# Patient Record
Sex: Female | Born: 1960 | Race: White | Hispanic: No | Marital: Married | Smoking: Never smoker
Health system: Southern US, Community
[De-identification: ages and names within clinical notes are randomized; demographics above are authoritative.]

## PROBLEM LIST (undated history)

## (undated) DIAGNOSIS — J45909 Unspecified asthma, uncomplicated: Secondary | ICD-10-CM

## (undated) DIAGNOSIS — T8859XA Other complications of anesthesia, initial encounter: Secondary | ICD-10-CM

## (undated) DIAGNOSIS — G4733 Obstructive sleep apnea (adult) (pediatric): Secondary | ICD-10-CM

## (undated) DIAGNOSIS — M1990 Unspecified osteoarthritis, unspecified site: Secondary | ICD-10-CM

## (undated) DIAGNOSIS — K219 Gastro-esophageal reflux disease without esophagitis: Secondary | ICD-10-CM

## (undated) DIAGNOSIS — J383 Other diseases of vocal cords: Secondary | ICD-10-CM

## (undated) DIAGNOSIS — T4145XA Adverse effect of unspecified anesthetic, initial encounter: Secondary | ICD-10-CM

## (undated) DIAGNOSIS — Z889 Allergy status to unspecified drugs, medicaments and biological substances status: Secondary | ICD-10-CM

## (undated) DIAGNOSIS — F32A Depression, unspecified: Secondary | ICD-10-CM

## (undated) HISTORY — PX: LIPOMA EXCISION: SHX5283

## (undated) HISTORY — PX: GANGLION CYST EXCISION: SHX1691

## (undated) HISTORY — PX: NASAL SINUS SURGERY: SHX719

## (undated) HISTORY — PX: CHOLECYSTECTOMY: SHX55

## (undated) HISTORY — DX: Gastro-esophageal reflux disease without esophagitis: K21.9

## (undated) HISTORY — DX: Depression, unspecified: F32.A

## (undated) HISTORY — PX: BREAST SURGERY: SHX581

## (undated) HISTORY — DX: Other diseases of vocal cords: J38.3

## (undated) HISTORY — DX: Obstructive sleep apnea (adult) (pediatric): G47.33

---
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
## 1995-02-28 HISTORY — PX: LASIK: SHX215

## 2005-04-28 ENCOUNTER — Other Ambulatory Visit: Admission: RE | Admit: 2005-04-28 | Discharge: 2005-04-28 | Payer: Self-pay | Admitting: Family Medicine

## 2006-02-15 ENCOUNTER — Ambulatory Visit: Payer: Self-pay | Admitting: Emergency Medicine

## 2006-03-23 ENCOUNTER — Ambulatory Visit: Payer: Self-pay | Admitting: Emergency Medicine

## 2006-04-23 ENCOUNTER — Ambulatory Visit: Payer: Self-pay | Admitting: Emergency Medicine

## 2006-05-25 ENCOUNTER — Ambulatory Visit (HOSPITAL_COMMUNITY): Admission: RE | Admit: 2006-05-25 | Discharge: 2006-05-25 | Payer: Self-pay | Admitting: Emergency Medicine

## 2006-06-04 ENCOUNTER — Ambulatory Visit: Payer: Self-pay | Admitting: Emergency Medicine

## 2006-12-19 DIAGNOSIS — J45909 Unspecified asthma, uncomplicated: Secondary | ICD-10-CM | POA: Insufficient documentation

## 2006-12-19 DIAGNOSIS — J309 Allergic rhinitis, unspecified: Secondary | ICD-10-CM | POA: Insufficient documentation

## 2006-12-19 DIAGNOSIS — J383 Other diseases of vocal cords: Secondary | ICD-10-CM

## 2006-12-19 DIAGNOSIS — K219 Gastro-esophageal reflux disease without esophagitis: Secondary | ICD-10-CM | POA: Insufficient documentation

## 2006-12-19 DIAGNOSIS — Z9089 Acquired absence of other organs: Secondary | ICD-10-CM | POA: Insufficient documentation

## 2006-12-19 DIAGNOSIS — G4733 Obstructive sleep apnea (adult) (pediatric): Secondary | ICD-10-CM | POA: Insufficient documentation

## 2006-12-19 HISTORY — DX: Acquired absence of other organs: Z90.89

## 2006-12-19 HISTORY — DX: Other diseases of vocal cords: J38.3

## 2007-06-21 ENCOUNTER — Encounter: Payer: Self-pay | Admitting: Emergency Medicine

## 2007-12-26 ENCOUNTER — Telehealth (INDEPENDENT_AMBULATORY_CARE_PROVIDER_SITE_OTHER): Payer: Self-pay | Admitting: *Deleted

## 2008-01-20 ENCOUNTER — Ambulatory Visit: Payer: Self-pay | Admitting: Emergency Medicine

## 2008-12-22 ENCOUNTER — Emergency Department (HOSPITAL_COMMUNITY): Admission: EM | Admit: 2008-12-22 | Discharge: 2008-12-22 | Payer: Self-pay | Admitting: Emergency Medicine

## 2009-01-13 ENCOUNTER — Encounter: Admission: RE | Admit: 2009-01-13 | Discharge: 2009-01-13 | Payer: Self-pay | Admitting: Surgery

## 2010-02-25 ENCOUNTER — Encounter
Admission: RE | Admit: 2010-02-25 | Discharge: 2010-02-25 | Payer: Self-pay | Source: Home / Self Care | Attending: *Deleted | Admitting: *Deleted

## 2010-06-02 LAB — URINALYSIS, ROUTINE W REFLEX MICROSCOPIC
Glucose, UA: NEGATIVE mg/dL
Protein, ur: NEGATIVE mg/dL
Specific Gravity, Urine: 1.006 (ref 1.005–1.030)
Urobilinogen, UA: 0.2 mg/dL (ref 0.0–1.0)

## 2010-06-02 LAB — COMPREHENSIVE METABOLIC PANEL
ALT: 14 U/L (ref 0–35)
AST: 16 U/L (ref 0–37)
Albumin: 3.5 g/dL (ref 3.5–5.2)
BUN: 7 mg/dL (ref 6–23)
Chloride: 108 mEq/L (ref 96–112)
GFR calc Af Amer: >60 mL/min (ref >60)
GFR calc non Af Amer: >60 mL/min (ref >60)
Glucose, Bld: 101 mg/dL — ABNORMAL HIGH (ref 70–99)
Potassium: 4 mEq/L (ref 3.5–5.1)
Total Bilirubin: 0.4 mg/dL (ref 0.3–1.2)

## 2010-06-02 LAB — LIPASE, BLOOD: Lipase: 21 U/L (ref 11–59)

## 2010-06-02 LAB — URINE MICROSCOPIC-ADD ON

## 2010-06-02 LAB — DIFFERENTIAL
Basophils Absolute: 0.2 10*3/uL — ABNORMAL HIGH (ref 0.0–0.1)
Basophils Relative: 3 % — ABNORMAL HIGH (ref 0–1)
Eosinophils Relative: 3 % (ref 0–5)
Lymphocytes Relative: 23 % (ref 12–46)
Monocytes Absolute: 0.9 10*3/uL (ref 0.1–1.0)

## 2010-06-02 LAB — CBC
MCHC: 34.6 g/dL (ref 30.0–36.0)
WBC: 7.8 10*3/uL (ref 4.0–10.5)

## 2010-07-15 NOTE — Assessment & Plan Note (Signed)
[facility]                             PULMONARY OFFICE NOTE   NAME:BIVINGSEmmabelle, Fear                      MRN:          [id number]  DATE:03/23/2006                            DOB:          10-16-1960    SUBJECTIVE:  Ms. Kandler is a 50 year old woman seen originally in  December of 2007 for asthma and superimposed upper airway symptoms in  the setting of upper respiratory infection and postnasal drip.  She has  completed her prednisone taper.  We also increased her Omeprazole to  twice daily and added Claritin and Nasonex for better control of her  nasal symptoms.  She tells me that she is doing much better, her  breathing has improved, and her exercise tolerance is almost at  baseline.  She was taking Symbicort twice a day but ran out of that  medication and is back to Advair 250/50 one inhalation twice a day.  Her  albuterol use has been very rare.  She still has triggers that bother  her including fumes, cold air, and smoke, these cause her to have  paroxysmal cough and shortness of breath.  She gets relief from  albuterol when she has this kind of exposure.  She has a hoarse voice  episodically, but this is much improved as well.  She originally was  going to go see Dr. Antony Contras with ENT but she tells me that she  might cancel this appointment given her improvement.   CURRENT MEDICATIONS:  1. Advair 250/50 one inhalation b.i.d.  2. Singulair 10 mg daily.  3. Omeprazole 20 mg b.i.d.  4. Nasonex nasal spray 2 sprays each nostril daily.  5. Zocor 20 mg daily.  6. Claritin 10 mg daily.   EXAMINATION:  GENERAL:  This is a pleasant overweight woman who is in no  distress on room air, her weight is 247 pounds, temperature 98.2, blood  pressure 144/82, heart rate is 71, SPO2 99% on room air.  HEENT:  She has a bit of mild posterior pharyngeal erythema, but for the  most part this is improved from our last visit.  NECK:  Supple without  stridor.  LUNGS:  Clear to auscultation bilaterally, during the normal respiratory  cycle she does have a very mild end expiratory wheeze with a forced  expiration.  There are no crackles.  HEART:  Has a regular rate and rhythm, she is borderline bradycardic,  otherwise normal.  ABDOMEN:  Benign.  EXTREMITIES:  Have no cyanosis, clubbing, or edema.  NEUROLOGIC:  Has a nonfocal exam.   IMPRESSION:  1. Asthma with triggers including fumes, cold air, and smoke.  2. Superimposed upper airway irritation with possible vocal chord      dysfunction.  This appears to be much improved with good control of      her gastroesophageal reflux disease and postnasal drip.  3. Postnasal drip and allergic rhinitis.  4. Gastroesophageal reflux disease.   PLANS:  1. Will continue her Advair 250/50 one inhalation b.i.d.  2. Continue Singulair, Claritin, and Nasonex as ordered.  I told her  that she can try decreasing the Omeprazole to once daily to see if      she can tolerate this change without any worsening in her upper      airway symptoms.  3. We will obtain full pulmonary function tests to assess her airflow      limitation now that she is back to baseline.  4. She will follow up with me in approximately one month to review the      results of her testing and to plan further therapy.     Leslye Peer, MD  Electronically Signed    RSB/MedQ  DD: 03/23/2006  DT: 03/23/2006  Job #: [id number]   cc:   Magnus Sinning. Rice, M.D.

## 2010-07-15 NOTE — Assessment & Plan Note (Signed)
[facility]                             PULMONARY OFFICE NOTE   NAME:BIVINGSNema, Oatley                      MRN:          [id number]  DATE:06/04/2006                            DOB:          22-Aug-1960    SUBJECTIVE:  Ms. Garlitz is a very pleasant 50 year old woman who  follows up today for her upper airway irritation and vocal cord symptoms  and possible superimposed trigger related asthma.  Since our last visit,  she has undergone provocational testing with a methacholine challenge as  detailed below.  She tells me that here breathing at this time is  okay.  She does occasionally get dyspneic when she exerts herself and  when she works in the yard.  She is taking Advair once daily, Singulair  and Nasacort once daily, and Zyrtec every other day.  She believes that  her allergy symptoms are fairly well controlled at this time.  She  rarely uses supplemental albuterol.   PHYSICAL EXAMINATION:  GENERAL:  This is a well-appearing overweight  woman who is in no distress on room air.  VITAL SIGNS:  Weight 252 pounds, temperature 97.6, blood pressure  122/78, heart rate 61, SPO2 100% on room air.  HEENT:  The oropharynx is clear.  She has some mild posterior pharyngeal  erythema.  There is no stridor.  Her voice is much improved compared  with our previous visits and is not hoarse at all.  LUNGS:  Clear to auscultation bilaterally without any wheezing on forced  expiration.  HEART:  Regular rate and rhythm without murmur.  ABDOMEN:  Benign.  EXTREMITIES:  No cyanosis, clubbing, or edema.   Methacholine challenge was performed on May 25, 2006, and this showed  normal pretest spirometry with markedly positive hyper-responsiveness to  methacholine and a PC (-20) less than 0.05 which reflected a positive  test on the first dose given.   IMPRESSION:  1. 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  2. Trigger related asthma with triggers that include cold air,      allergic exposures including working in the yard, and exercise.  We      discussed the different options including continuing maintenance      everyday therapy versus discontinuing this and using bronchodilator      in the setting of triggers and with symptoms.  She would like a      trial off of Advair to pursue treating her in the setting of      triggers.  I will stop her Advair, continue her albuterol which she      will use on as-needed basis, and she will also attempt to pretreat      before working in the yard, exerting herself, or being exposed to      cold air.  We will change her Zyrtec to everyday and she will      continue her Nasacort and Singulair everyday as already ordered.  3. She will follow up with me in 2-3 months or sooner  should she have      any difficulty in the interim.     Leslye Peer, MD  Electronically Signed    RSB/MedQ  DD: 06/04/2006  DT: 06/04/2006  Job #: [id number]   cc:   Magnus Sinning. Rice, M.D.

## 2010-07-15 NOTE — Assessment & Plan Note (Signed)
[facility]                             PULMONARY OFFICE NOTE   NAME:[name], Stone                      MRN:          [id number]  DATE:02/15/2006                            DOB:          05-03-1960    PULMONARY CONSULTATION   REASON FOR CONSULTATION:  We are asked by Dr. Dimple Casey in [place], [place], to evaluate Amber Stone for asthma.   BRIEF HISTORY:  Amber Stone is a 50 year old woman with a history of  sinusitis and sinus surgery in 1992.  She has also been diagnosed with  obstructive sleep apnea for which she uses CPAP.  Finally, she was  diagnosed in 2005 with asthma.  Her asthma had been fairly well  controlled and had been followed by Dr. Lowanda Foster.  She unfortunately  experienced a significant exacerbation beginning December 1 of this  year.  She feels that this may have been in the setting of second-hand  smoke exposure.  She also notes that she was emotional at the time and  angry at her husband and wonders whether there could have been an  emotional component to her symptoms.  Regardless, she has remained short  of breath and has had wheezing since that time.  She has been seen by  Dr. Dimple Casey who performed a chest x-ray that did not show any significant  abnormality.  She was treated with prednisone and azithromycin beginning  on December 5 with a slow taper.  She was seen again on December 11 with  continued wheezing, chest and neck tightness, and shortness of breath.  At that appointment, she was noted to have oral thrush and her  maintenance Advair was changed to Symbicort.  She was also restarted on  higher dose prednisone with a slower taper.  She reported that she had  been using her rescue inhaler about 3-4 times daily at that time.  She  continues to hearing wheezing which she describes as a loud noise that  can be heard by others.  She is also feeling weak and has a hoarse  voice.  She now has shortness of breath with  exertion and has to stop  when she climbs the stairs.  She also feels tightness in her neck.  Yesterday, she began to have a cough which is nonproductive.  She has  had decreased energy and states that since beginning the prednisone, she  has had insomnia and daytime sleepiness.  Her review of systems is  otherwise significant for some dull aching in her back and acid  indigestion which is usually well-controlled.  She denies any  significant postnasal nasal drip.   PAST MEDICAL HISTORY:  1. Asthma.  2. Obstructive sleep apnea on CPAP at 4 cm of water.  3. Sinusitis with a history of sinus surgery in 1992.  4. GERD.  5. Tonsillectomy in 1978.   ALLERGIES:  No known drug allergies.   CURRENT MEDICATIONS:  1. Symbicort 160/4.5 two puffs b.i.d.  2. Singulair 10 mg daily.  3. Omeprazole 20 mg daily.  4. Nasonex nasal spray 2 sprays to each nostril daily.  5. Zocor 20 mg daily.  6. Prednisone 40 mg daily.   SOCIAL HISTORY:  The patient is married.  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  She does not have any significant  tobacco exposure.  She drinks alcohol occasionally.  She was in the  [employer] and worked at [employer] in the past.  She does not  have any known TB exposures.  She has 2 cats in the home and a remote  exposure to a bird but has not been exposed to this since 1980.   FAMILY HISTORY:  Significant for emphysema in her grandfather, coronary  disease in both parents, and rheumatoid disease in her mother's family.   REVIEW OF SYSTEMS:  As per the HPI.   EXAM:  GENERAL:  This is a well-appearing woman in no distress on room  air.  Her weight is 243 pounds.  Temperature is 98.2.  Blood pressure 144/88.  Heart rate 73.  SPO2 96%  on room air.  HEENT:  She has a narrow oropharynx.  There is no evidence of posterior  pharyngeal erythema or thrush.  NECK:  Without stridor.  LUNGS:  Clear to auscultation bilaterally.  HEART:  Regular rate  and rhythm without murmur.  ABDOMEN:  Soft, nontender, nondistended with positive bowel sounds.  EXTREMITIES:  No cyanosis, clubbing, or edema.  Neurologically, she has a grossly nonfocal exam.   IMPRESSION:  1. Probable asthma.  2. 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   PLAN:  1. A quick taper of her prednisone as this may be actually      exacerbating GERD and any impact the GERD is having on her upper      airway disease.  2. Increase her omeprazole to 20 mg b.i.d.  3. Add Claritin over-the-counter 10 mg daily to her Nasonex daily.  4. Continue her Symbicort and albuterol p.r.n.  5. I will follow up with Amber Stone in 3 weeks.  6. After her current exacerbation is improved, then we will perform      full pulmonary function testing to better quantify her air flow      limitation.     Leslye Peer, MD  Electronically Signed    RSB/MedQ  DD: 02/23/2006  DT: 02/23/2006  Job #: [id number]

## 2010-07-15 NOTE — Assessment & Plan Note (Signed)
[facility]                             PULMONARY OFFICE NOTE   NAME:BIVINGSLamar, Stone                      MRN:          [id number]  DATE:04/23/2006                            DOB:          04-17-1960    [name] is a pleasant 50 year old woman who follows up today with  regard to her shortness of breath and possible asthma.  She has had  pulmonary function testing performed, and we are here to discuss the  results.  Overall, she tells me that her breathing is doing fairly well.  She does still have episodes of dyspnea that appear to be triggered by  cold and also perfumes, and strong odors including smoke.  She has had  trouble with postnasal drip and hoarse voice.  We had started her on  Claritin to help with postnasal drip, and this was effective, but it may  have increased her blood pressure, and so the medicine was stopped.   CURRENT MEDICATIONS:  1. Advair 250/50 one inhalation daily.  2. Singulair 10 mg daily.  3. Omeprazole 20 mg b.i.d.  4. Nasonex nasal spray 2 sprays each nostril daily.  5. Zocor 20 mg daily.  6. Albuterol 2 puffs q. 4 hours p.r.n. for shortness of breath.   EXAMINATION:  GENERAL:  This is a pleasant, well-appearing, obese woman  who is in no distress on room air.  Her weight is 251 pounds.  Temperature 97.4.  Blood pressure 126/80.  Heart rate 59.  SP02 99% on room air.  HEENT EXAM:  Her voice is stronger and less hoarse than previous visits.  Her oropharynx is clear.  NECK:  Large, but supple.  There is no lymphadenopathy or stridor.  LUNGS:  Clear to auscultation bilaterally.  She has no wheezing on a  forced expiration.  HEART:  Has a regular rate and rhythm without murmur.  ABDOMEN:  Obese, soft and non-tender with positive bowel sounds.  EXTREMITIES:  Have no cyanosis, clubbing or edema.  Pulmonary function testing performed on April 23, 2006 showed grossly  normal spirometry with an FVC of 3.43 or 92% of  predicted, and FEV1 of  2.81 or 98% of predicted, and a ratio of 82%.  There was no  bronchodilator responsiveness.  Her flow volume showed some evidence of  inspiratory loop chinking suggestive of possible upper airway  inflammation or vocal cord dysfunction.  The expiratory phase of her  loop was normal.  Her lung volumes were slightly decreased with a total  lung capacity of 77% predicted, which could be consistent with mild  restriction.  Her effusion capacity was slightly decreased at 74% of  predicted and corrected into the normal range when adjusted for alveolar  volume.   IMPRESSION:  1. 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  This appears to be improved now      that she is on omeprazole for her gastroesophageal reflux disease,      but postnasal drip is still a strong contributor.  2. Possible superimposed trigger related  asthma with sensitivity to      fumes, smoke and cold air.  3. Allergic rhinitis and postnasal drip that appears to be poorly      controlled.  4. Gastroesophageal reflux disease that appears to be well controlled.   PLAN:  1. I will continue her Advair 250/50 one inhalation b.i.d. for now and      albuterol p.r.n.  2. We need to more tightly control her postnasal drip.  She will try      Zyrtec and she if she tolerates this better from a blood pressure      standpoint.  We will continue her nasal steroid.  She will also      consider starting nasal saline washes.  3. I will order a methacholine challenge to evaluate for lower airways      bronchial hyperresponsiveness.  If this is present, then I believe      she will need to stay on maintenance therapy with Advair.  If not,      we may be able to consider peeling this medication off.  4. I will follow up with Amber Stone in 6 weeks, or sooner should she      have any difficulties.     Amber Peer, MD  Electronically Signed     RSB/MedQ  DD: 04/24/2006  DT: 04/24/2006  Job #: [id number]   cc:   Amber Stone, M.D.

## 2010-08-17 ENCOUNTER — Encounter: Payer: Self-pay | Admitting: Emergency Medicine

## 2010-08-18 ENCOUNTER — Ambulatory Visit (INDEPENDENT_AMBULATORY_CARE_PROVIDER_SITE_OTHER): Payer: 59 | Admitting: Emergency Medicine

## 2010-08-18 ENCOUNTER — Encounter: Payer: Self-pay | Admitting: Emergency Medicine

## 2010-08-18 VITALS — BP 120/84 | HR 74 | Temp 97.7°F | Ht 67.5 in | Wt 226.0 lb

## 2010-08-18 DIAGNOSIS — J45909 Unspecified asthma, uncomplicated: Secondary | ICD-10-CM

## 2010-08-18 MED ORDER — PREDNISONE 50 MG PO TABS: 50.0000 mg | ORAL_TABLET | Freq: Every day | ORAL | Status: AC

## 2010-08-18 NOTE — Patient Instructions (Addendum)
Stop Symbicort Finish your Levaquin prescription Take prednisone 5 days as directed Continue your current allergy medications Continue your omeprazole.  Follow up with Dr Delton Coombes in 1 month

## 2010-08-18 NOTE — Assessment & Plan Note (Signed)
No wheeze today, suspect asthma component has been treated and she has residual UA irritation Pred burst Hold symboicort rov in 1 mo

## 2010-08-18 NOTE — Progress Notes (Signed)
  Subjective:    Patient ID: Amber Stone, female    DOB: 1960-11-26, 50 y.o.   MRN: [id number]  HPI [name] is a very pleasant 50 year old woman, hx upper airway irritation and vocal cord symptoms and superimposed trigger related asthma (with positive methacholine!). Also with OSA. Last seen in 12/2007.   Has not had frequent flares in the interim. Began to have UA noise, wheeze, severe cough end of May. Has been taking her zyrtec, singulair, nasonex. Rarely needs albuterol if not exacerbated. Not on Advair - she started Symbicort early June. Was treated with pred taper, levaquin. Another round levaquin just started.    CXR 08/15/10 -- no infiltrates, mild hyperinflation.     Review of Systems As above    Objective:   Physical Exam Gen: Pleasant, overweight, in no distress,  normal affect  ENT: No lesions,  mouth clear,  oropharynx clear, no postnasal drip, hoarse voice  Neck: No JVD, no TMG, no carotid bruits  Lungs: No wheezing, no crackles  Cardiovascular: RRR, heart sounds normal, no murmur or gallops, no peripheral edema  Musculoskeletal: No deformities, no cyanosis or clubbing  Neuro: alert, non focal  Skin: Warm, no lesions or rashes      Assessment & Plan:

## 2010-08-23 ENCOUNTER — Telehealth: Payer: Self-pay | Admitting: Emergency Medicine

## 2010-08-23 NOTE — Telephone Encounter (Signed)
Spoke with pt and notified of recs per RB. She verbalized understanding. States that she is going to give a few days, and then call us back to discuss sinus eval.

## 2010-08-23 NOTE — Telephone Encounter (Signed)
I think this is upper airway irritation - doubt any more pred or abx will impact this. Needs to stay on her allergy regimen and her omeprazole. Consider sinus eval? Can see her back at [facility] next available to discuss and possibly perform sinus CT scan depending on her sx.

## 2010-08-23 NOTE — Telephone Encounter (Signed)
Spoke with pt. She states that she was here last on 08/18/10- she has finished her pred taper and levaquin and cough is not much improved, still producing dark green sputum and has "tight feeling in throat", hoarsness. Would like to know if needs additional rxs. Please advise, thanks!

## 2010-09-26 ENCOUNTER — Ambulatory Visit: Payer: 59 | Admitting: Emergency Medicine

## 2011-05-14 IMAGING — US US ABDOMEN COMPLETE
1 series · 14 of 25 positions shown · non-contrast
Comparison: None.

CLINICAL DATA: Abdominal pain.

ABDOMINAL ULTRASOUND COMPLETE

[Series 1: us abdomen complete · 0.30mm/px · 14 of 59 slices shown]
[im 1/59]
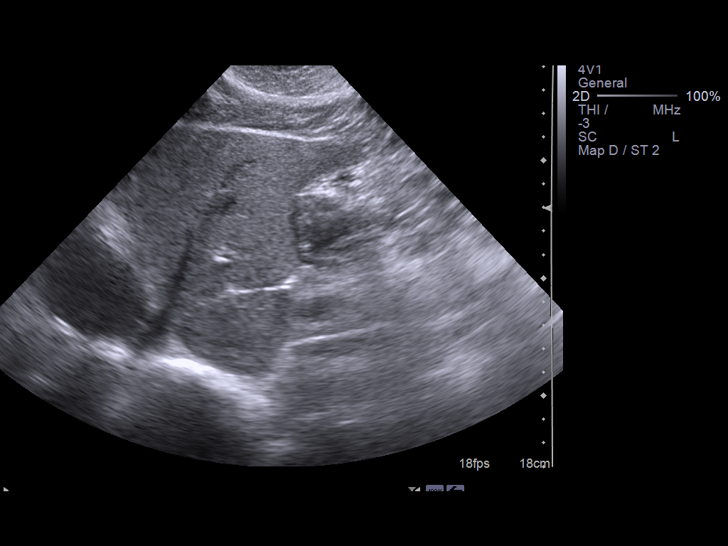
[im 5/59]
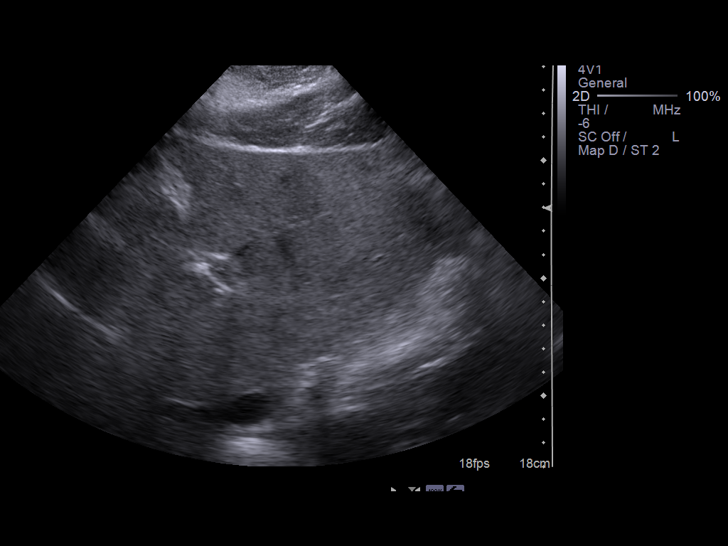
[im 10/59]
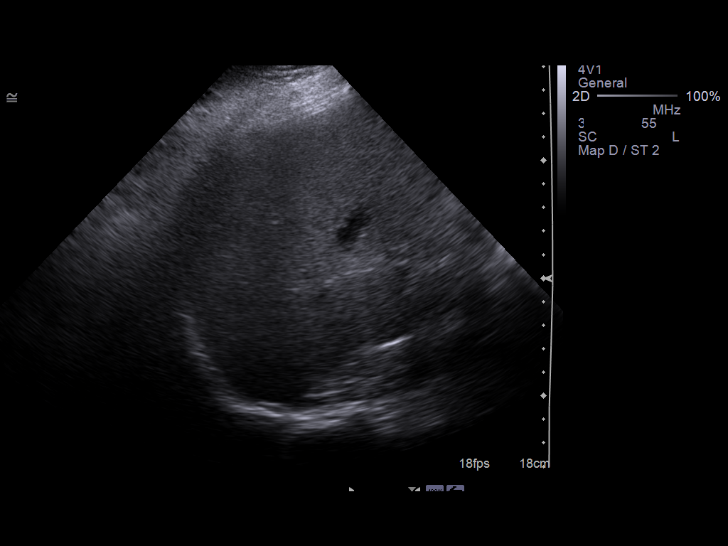
[im 15/59]
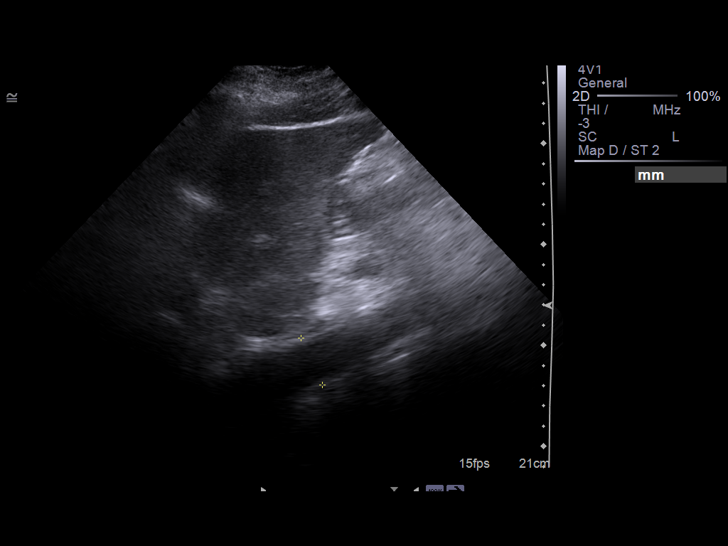
[im 20/59]
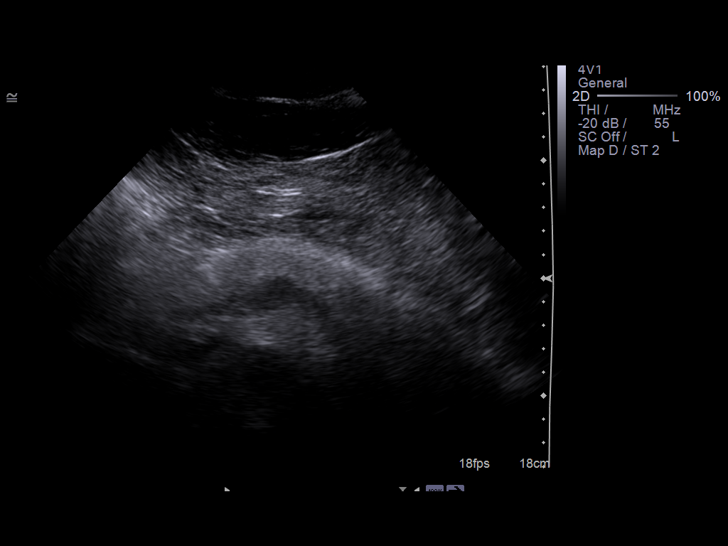
[im 22/59]
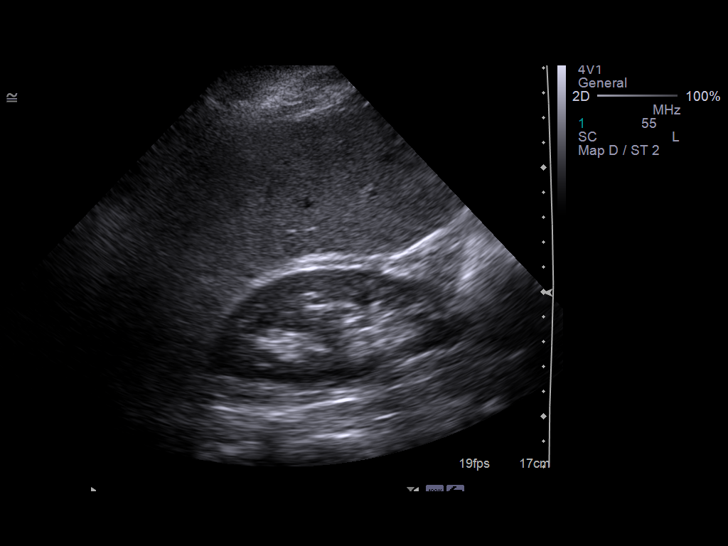
[im 27/59]
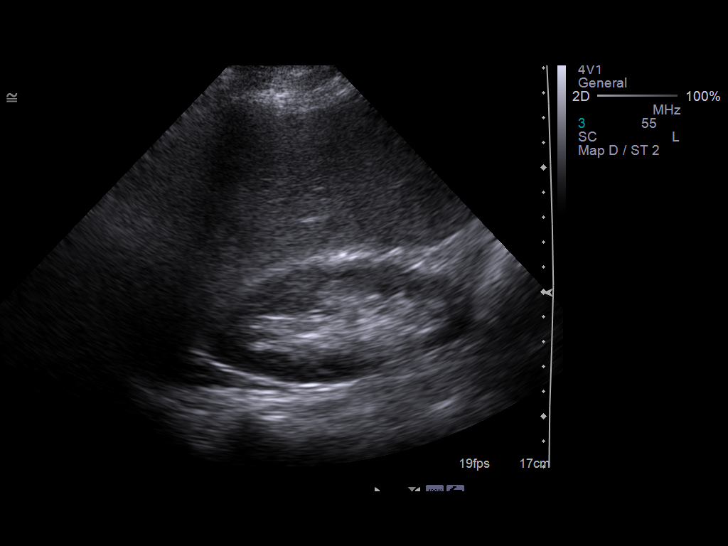
[im 32/59]
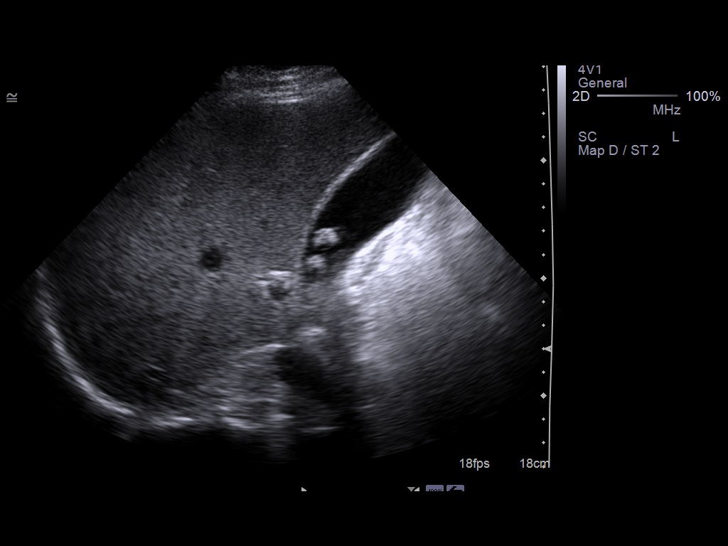
[im 37/59]
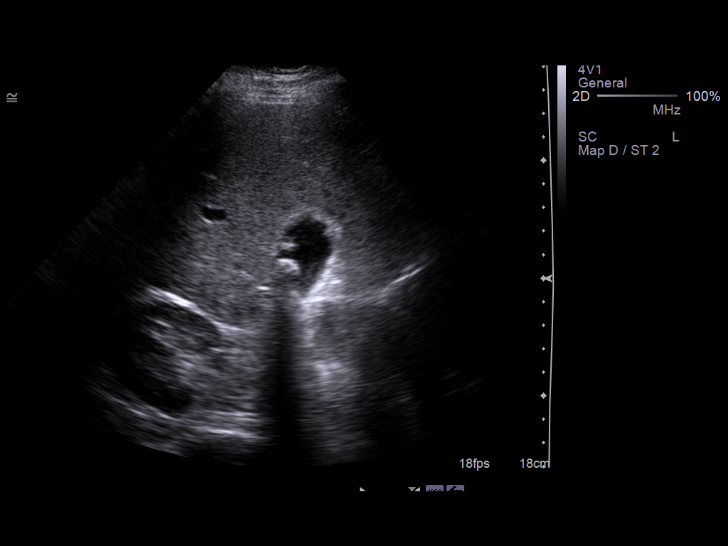
[im 39/59]
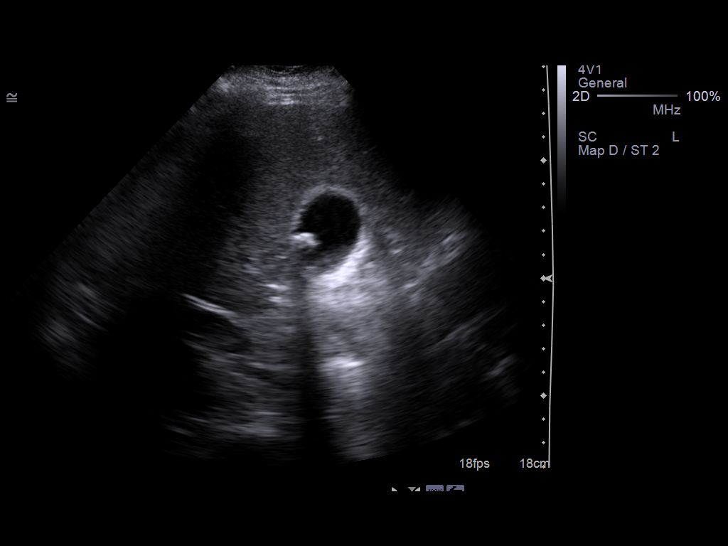
[im 44/59]
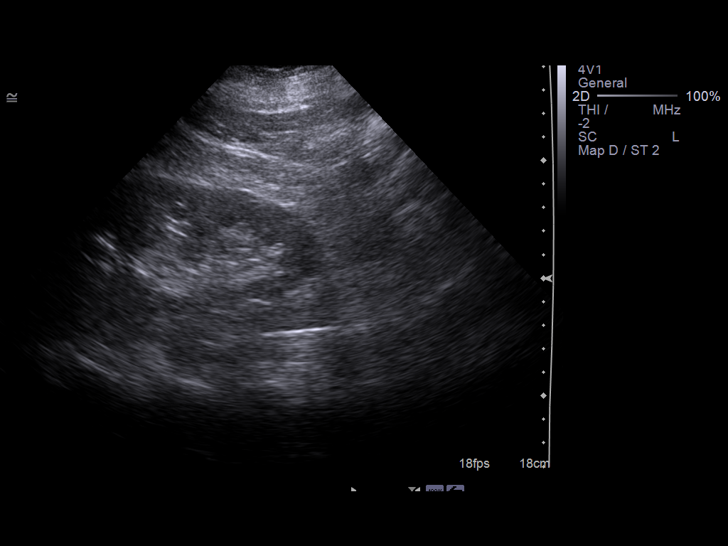
[im 49/59]
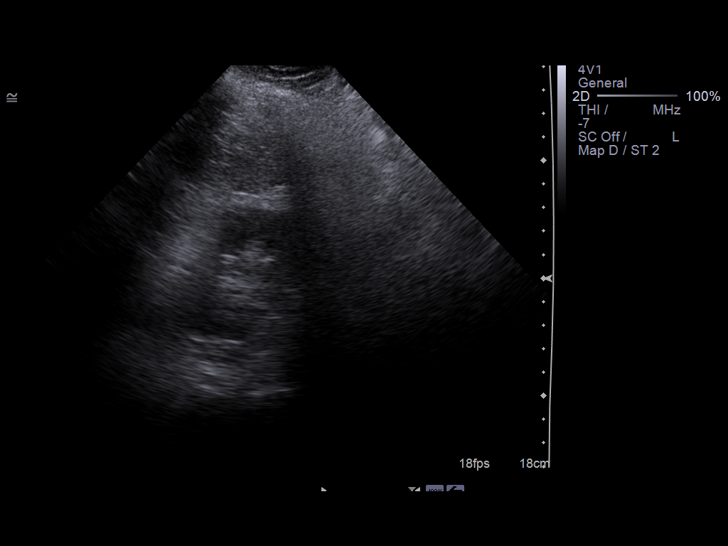
[im 54/59]
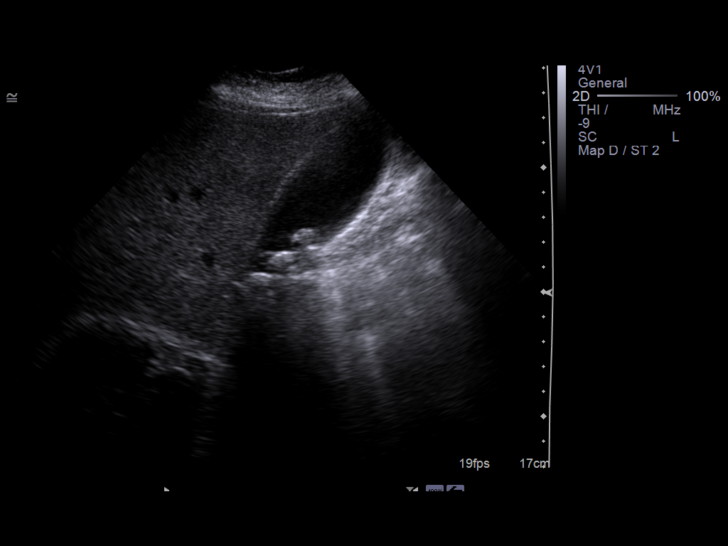
[im 59/59]
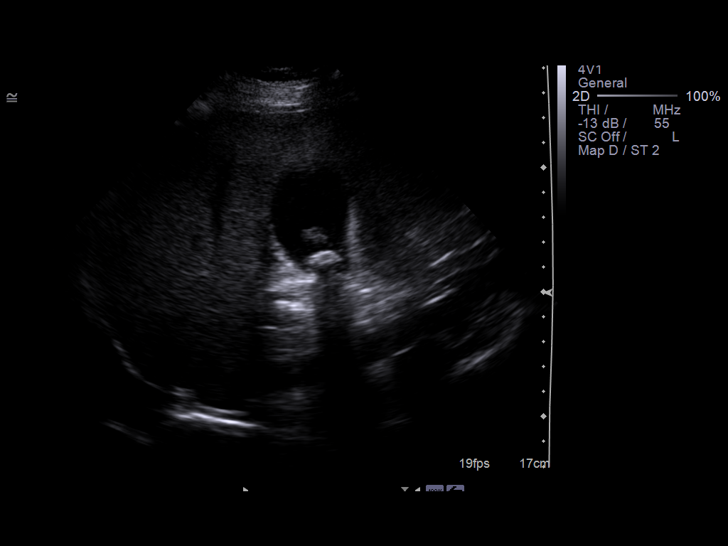

[14 of 25 positions shown; findings below may reference images not displayed]

FINDINGS: Gallbladder:  Multiple gallstones in the gallbladder, the largest
measuring 1.4 cm in maximum diameter.  Mild diffuse gallbladder
wall thickening with a maximum thickness of 3.9 mm.  No
pericholecystic fluid.  The patient was not tender over the
gallbladder.

Common bile duct:  Normal diameter, measuring 3.3 mm proximally.

Liver:  Normal.

Inferior vena cava:  The visualized portion appears normal.

Pancreas:  Normal.

Spleen:  Normal, measuring 5.6 cm in length.

Right kidney:  Normal, measuring 11.1 cm in length.

Left kidney:  Normal, measuring 12.3 cm in length.

Abdominal aorta:  Normal caliber without aneurysm.

Other:  No free peritoneal fluid.
IMPRESSION: 1.  Cholelithiasis.
2.  Mild diffuse gallbladder wall thickening, most likely due to
chronic cholecystitis.
3.  Otherwise, unremarkable examination.

## 2011-11-03 ENCOUNTER — Encounter (INDEPENDENT_AMBULATORY_CARE_PROVIDER_SITE_OTHER): Payer: Self-pay | Admitting: General Surgery

## 2011-11-10 ENCOUNTER — Ambulatory Visit (INDEPENDENT_AMBULATORY_CARE_PROVIDER_SITE_OTHER): Payer: Self-pay | Admitting: General Surgery

## 2012-03-19 DIAGNOSIS — N63 Unspecified lump in unspecified breast: Secondary | ICD-10-CM | POA: Insufficient documentation

## 2012-03-26 DIAGNOSIS — I82A19 Acute embolism and thrombosis of unspecified axillary vein: Secondary | ICD-10-CM | POA: Insufficient documentation

## 2012-03-26 DIAGNOSIS — I82B12 Acute embolism and thrombosis of left subclavian vein: Secondary | ICD-10-CM | POA: Insufficient documentation

## 2013-03-26 ENCOUNTER — Ambulatory Visit (INDEPENDENT_AMBULATORY_CARE_PROVIDER_SITE_OTHER): Payer: BC Managed Care – PPO | Admitting: Surgery

## 2013-03-26 ENCOUNTER — Encounter (INDEPENDENT_AMBULATORY_CARE_PROVIDER_SITE_OTHER): Payer: Self-pay | Admitting: Surgery

## 2013-03-26 VITALS — BP 148/100 | HR 77 | Temp 98.4°F | Resp 14 | Ht 68.0 in | Wt 234.4 lb

## 2013-03-26 DIAGNOSIS — M7989 Other specified soft tissue disorders: Secondary | ICD-10-CM

## 2013-03-26 DIAGNOSIS — M799 Soft tissue disorder, unspecified: Secondary | ICD-10-CM

## 2013-03-26 HISTORY — DX: Other specified soft tissue disorders: M79.89

## 2013-03-26 NOTE — Progress Notes (Signed)
[facility]  Chief Complaint  Patient presents with  . New Evaluation    lipoma - referral from Dr. Octavio Graves    HISTORY: Patient is a 53 year old female referred by her primary care physician for evaluation of soft tissue mass arising at the left costal margin. Patient first noted this before Christmas and 2014. She feels as though it has gradually increased in size. He does not cause any discomfort. She denies any history of trauma. She denies any signs of infection or other cutaneous changes. 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  Past Medical History  Diagnosis Date  . Asthma   . GERD (gastroesophageal reflux disease)   . Allergic rhinitis   . OSA (obstructive sleep apnea)   . Disorder of vocal cords     Current Outpatient Prescriptions  Medication Sig Dispense Refill  . albuterol (PROAIR HFA) 108 (90 BASE) MCG/ACT inhaler Inhale 2 puffs into the lungs every 4 (four) hours as needed.        . cetirizine (ZYRTEC) 10 MG tablet Take 10 mg by mouth daily.      . Cholecalciferol (VITAMIN D-3) 5000 UNITS TABS Take by mouth.      . Fish Oil OIL by Does not apply route.      Marland Kitchen ipratropium-albuterol (DUONEB) 0.5-2.5 (3) MG/3ML SOLN       . mometasone (NASONEX) 50 MCG/ACT nasal spray Place 2 sprays into the nose daily.        . montelukast (SINGULAIR) 10 MG tablet Take 10 mg by mouth at bedtime.        . Multiple Vitamin (MULTIVITAMIN) capsule Take 1 capsule by mouth daily.      Marland Kitchen omeprazole (PRILOSEC) 20 MG capsule Take 20 mg by mouth daily.        . pravastatin (PRAVACHOL) 40 MG tablet        No current facility-administered medications for this visit.    No Known Allergies  Family History  Problem Relation Age of Onset  . Emphysema    . Coronary artery disease Mother   . Rheum arthritis Mother   . Coronary artery disease Father   . Heart disease Father   . Cancer  Paternal Aunt     breast  . Cancer Maternal Grandfather     leukemia  . Cancer Paternal Grandmother     breast    History   Social History  . Marital Status: Married    Spouse Name: N/A    Number of Children: N/A  . Years of Education: N/A   Social History Main Topics  . Smoking status: Never Smoker   . Smokeless tobacco: Never Used  . Alcohol Use: No  . Drug Use: No  . Sexual Activity: None   Other Topics Concern  . None   Social History Narrative  . None    REVIEW OF SYSTEMS - PERTINENT POSITIVES ONLY: Palpable abnormalities left costal margin. No sign of infection. No history of trauma. No pain.  EXAM: Filed Vitals:   03/26/13 0935  BP: 148/100  Pulse: 77  Temp: 98.4 F (36.9 C)  Resp: 14    GENERAL: well-developed, well-nourished, no acute distress HEENT: normocephalic; pupils equal and reactive; sclerae clear; dentition good; mucous membranes moist CHEST: clear to auscultation bilaterally without rales, rhonchi, or wheezes CARDIAC: regular rate and rhythm without significant murmur; peripheral pulses are full  CHEST: Diffuse thickening of soft tissue in the subcutaneous space left anterior costal margin; slight asymmetry of the costal margins with the left protruding more anteriorly; no discrete mass; no tenderness BACK:  Palpable soft tissue mass low lumbar region just to the left of midline, approximately 3 cm in size, consistent with lipoma EXT:  non-tender without edema; no deformity NEURO: no gross focal deficits; no sign of tremor   LABORATORY RESULTS: See [facility] (CHL-Epic) for most recent results  RADIOLOGY RESULTS: See [facility] (CHL-Epic) for most recent results  IMPRESSION: #1 soft tissue thickening anterior left costal margin of undetermined significance #2 soft tissue mass low in the left lumbar region consistent with lipoma  PLAN: I discussed both of the above findings with the patient and her husband. I explained that  these both appear to be benign processes that are not symptomatic. The thickening of tissue at the left costal margin does not appear to be a lipoma. I find no worrisome features on exam. I've asked the patient to apply topical creams and massage this once daily. I would like to reexamine her in 6 weeks. At that point we will decide about further diagnostic studies or surgical intervention or expected management.  Earnstine Regal, MD, [facility]  Primary Care Physician: Octavio Graves, DO

## 2013-03-26 NOTE — Patient Instructions (Signed)
  COCOA BUTTER & VITAMIN E CREAM  ([employer] or other brand)  Apply cocoa butter/vitamin E cream to your incision 2 - 3 times daily.  Massage cream into incision for one minute with each application.  Use sunscreen (50 SPF or higher) for first 6 months after surgery if area is exposed to sun.  You may substitute Mederma or other scar reducing creams as desired.   

## 2013-05-06 ENCOUNTER — Ambulatory Visit (INDEPENDENT_AMBULATORY_CARE_PROVIDER_SITE_OTHER): Payer: BC Managed Care – PPO | Admitting: Surgery

## 2013-05-06 ENCOUNTER — Encounter (INDEPENDENT_AMBULATORY_CARE_PROVIDER_SITE_OTHER): Payer: Self-pay | Admitting: Surgery

## 2013-05-06 VITALS — BP 126/82 | Temp 98.6°F | Resp 16 | Ht 67.5 in | Wt 235.6 lb

## 2013-05-06 DIAGNOSIS — M799 Soft tissue disorder, unspecified: Secondary | ICD-10-CM

## 2013-05-06 DIAGNOSIS — M7989 Other specified soft tissue disorders: Secondary | ICD-10-CM

## 2013-05-06 NOTE — Addendum Note (Signed)
Addended by: Illene Regulus on: 05/06/2013 05:18 PM   Modules accepted: Orders

## 2013-05-06 NOTE — Progress Notes (Signed)
General Surgery [facility]  Chief Complaint  Patient presents with  . Follow-up    mass left costal margin    HISTORY: Patient is a 53 year old female previously evaluated for possible lipoma at the left costal margin and another lipoma in the lumbar region of the lower back. Patient was initially seen and evaluated. She was performing massage treatment to the area. I have asked her to return for repeat evaluation.  PERTINENT REVIEW OF SYSTEMS: Patient notes that the mass at the left costal margin has become larger and more firm. The lumbar mass does not cause her any discomfort.  EXAM: HEENT: normocephalic; pupils equal and reactive; sclerae clear; dentition good; mucous membranes moist NECK:  symmetric on extension; no palpable anterior or posterior cervical lymphadenopathy; no supraclavicular masses; no tenderness CHEST: Visual examination of the left chest wall shows a mass at the mid left costal margin approximately in the anterior axillary line; palpation shows this to be quite firm and more prominent than on her last examination. It extends over an area of approximately 6 cm. It is asymmetric when compared to the right costal margin. EXT:  non-tender without edema; no deformity NEURO: no gross focal deficits; no sign of tremor   IMPRESSION: Firm mass involving approximately the 10th or ninth rib on the left side at the anterior axillary line of undetermined significance  PLAN: I discussed this with the patient and her spouse. I discussed this with the radiologist by telephone. They have recommended MRI scan with and without contrast. We will arrange for this study in the near future. I will contact the patient with the results.  Earnstine Regal, MD, [facility] Office: [phone]  Visit Diagnoses: 1. Soft tissue mass, left anterior costal margin

## 2013-05-07 ENCOUNTER — Telehealth (INDEPENDENT_AMBULATORY_CARE_PROVIDER_SITE_OTHER): Payer: Self-pay | Admitting: *Deleted

## 2013-05-07 NOTE — Telephone Encounter (Signed)
I spoke with Amber Stone and informed her of the appt for her MRI at [facility] on 05/13/13 with an arrival time of 4:30pm.  I instructed her not to have any solid foods 4 hours prior to scan.  She is also requesting something to relax her during the MRI.  I informed her that I would send a message to Dr. Harlow Asa.  She is agreeable with this plan at this time.

## 2013-05-08 NOTE — Telephone Encounter (Signed)
Pt advised of attached msg from Dr Harlow Asa. Pt states she understands and should be fine.

## 2013-05-08 NOTE — Telephone Encounter (Signed)
Sedation is not necessary for this type of MRI scan.  Earnstine Regal, MD, [facility] Office: [phone]

## 2013-05-12 ENCOUNTER — Telehealth (INDEPENDENT_AMBULATORY_CARE_PROVIDER_SITE_OTHER): Payer: Self-pay | Admitting: *Deleted

## 2013-05-12 NOTE — Telephone Encounter (Signed)
MRI abdomen W Wo contrast scheduled for 05/13/13 .Marland Kitchen Auth #:[id number] per online.

## 2013-05-13 ENCOUNTER — Ambulatory Visit
Admission: RE | Admit: 2013-05-13 | Discharge: 2013-05-13 | Disposition: A | Discharge status: Home / Self Care | Payer: BC Managed Care – PPO | Source: Ambulatory Visit | Attending: Surgery | Admitting: Surgery

## 2013-05-13 DIAGNOSIS — M7989 Other specified soft tissue disorders: Secondary | ICD-10-CM

## 2013-05-13 MED ORDER — GADOBENATE DIMEGLUMINE 529 MG/ML IV SOLN
20.0000 mL | Freq: Once | INTRAVENOUS | Status: AC | PRN
  Administered 2013-05-13: 20 mL via INTRAVENOUS

## 2013-05-15 ENCOUNTER — Telehealth (INDEPENDENT_AMBULATORY_CARE_PROVIDER_SITE_OTHER): Payer: Self-pay | Admitting: Surgery

## 2013-05-15 DIAGNOSIS — M7989 Other specified soft tissue disorders: Secondary | ICD-10-CM

## 2013-05-15 NOTE — Telephone Encounter (Signed)
Telephone call to patient with MRI results.  No evidence of neoplasm.  Acute angulation of left 8th rib.  Suggest thoracic surgery evaluation if indicated.  Earnstine Regal, MD, [facility] Office: [phone]

## 2013-09-05 ENCOUNTER — Ambulatory Visit (INDEPENDENT_AMBULATORY_CARE_PROVIDER_SITE_OTHER): Payer: BC Managed Care – PPO | Admitting: Emergency Medicine

## 2013-09-05 ENCOUNTER — Encounter: Payer: Self-pay | Admitting: Emergency Medicine

## 2013-09-05 VITALS — BP 144/98 | HR 81 | Ht 67.5 in | Wt 239.0 lb

## 2013-09-05 DIAGNOSIS — J45909 Unspecified asthma, uncomplicated: Secondary | ICD-10-CM

## 2013-09-05 MED ORDER — TRAMADOL HCL 50 MG PO TABS: 50.0000 mg | ORAL_TABLET | Freq: Four times a day (QID) | ORAL | Status: DC | PRN

## 2013-09-05 MED ORDER — PREDNISONE 20 MG PO TABS: 40.0000 mg | ORAL_TABLET | Freq: Every day | ORAL | Status: DC

## 2013-09-05 NOTE — Assessment & Plan Note (Signed)
-   pred 40 x 5 days - ultram for cough / pain - restart symbicort for now; consider stopping after this flare clears vs continuing indefinitely.  - albuterol prn - repeat spirometry when she has recovered.  - rov 1

## 2013-09-05 NOTE — Progress Notes (Signed)
Subjective:    Patient ID: Amber Stone, female    DOB: [birth year], 53 y.o.   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 She has GERD and allergies. We havn't seen each other for almost 3 years. She has been doing fairly well - has had a few discrete flares that were related to triggers. She was treated for a flare over the last month with rounds of pred and abx. She is left with coughing, no-prod. She is more dyspneic than her baseline - helped by albuterol. She hears wheeze w cough. She required duonebs early in the course. Averaging albuterol 2x a day now. Her GERD is well- controlled.    Review of Systems  Constitutional: Negative for fever and unexpected weight change.  HENT: Positive for postnasal drip. Negative for congestion, dental problem, ear pain, nosebleeds, rhinorrhea, sinus pressure, sneezing, sore throat and trouble swallowing.   Eyes: Negative for redness and itching.  Respiratory: Positive for cough and shortness of breath. Negative for chest tightness and wheezing.   Cardiovascular: Positive for chest pain. Negative for palpitations and leg swelling.  Gastrointestinal: Negative for nausea and vomiting.  Genitourinary: Negative for dysuria.  Musculoskeletal: Negative for joint swelling.  Skin: Negative for rash.  Neurological: Negative for headaches.  Hematological: Does not bruise/bleed easily.  Psychiatric/Behavioral: Negative for dysphoric mood. The patient is not nervous/anxious.    Past Medical History  Diagnosis Date  . Asthma   . GERD (gastroesophageal reflux disease)   . Allergic rhinitis   . OSA (obstructive sleep apnea)   . Disorder of vocal cords      Family History  Problem Relation Age of Onset  . Emphysema    . Coronary artery disease Mother   . Rheum arthritis Mother   . Coronary artery disease Father   . Heart disease Father   . Cancer Paternal Aunt    breast  . Cancer Maternal Grandfather     leukemia  . Cancer Paternal Grandmother     breast     History   Social History  . Marital Status: Married    Spouse Name: N/A    Number of Children: N/A  . Years of Education: N/A   Occupational History  . medical biller    Social History Main Topics  . Smoking status: Never Smoker   . Smokeless tobacco: Never Used  . Alcohol Use: No  . Drug Use: No  . Sexual Activity: Not on file   Other Topics Concern  . Not on file   Social History Narrative  . No narrative on file     No Known Allergies   Outpatient Prescriptions Prior to Visit  Medication Sig Dispense Refill  . albuterol (PROAIR HFA) 108 (90 BASE) MCG/ACT inhaler Inhale 2 puffs into the lungs every 4 (four) hours as needed.        . cetirizine (ZYRTEC) 10 MG tablet Take 10 mg by mouth daily.      . Cholecalciferol (VITAMIN D-3) 5000 UNITS TABS Take by mouth.      . Fish Oil OIL by Does not apply route.      Marland Kitchen ipratropium-albuterol (DUONEB) 0.5-2.5 (3) MG/3ML SOLN       . mometasone (NASONEX) 50 MCG/ACT nasal spray Place 2 sprays into the nose daily.        . montelukast (SINGULAIR) 10 MG tablet Take 10 mg by mouth at bedtime.        Marland Kitchen  Multiple Vitamin (MULTIVITAMIN) capsule Take 1 capsule by mouth daily.      Marland Kitchen omeprazole (PRILOSEC) 20 MG capsule Take 20 mg by mouth daily.        . pravastatin (PRAVACHOL) 40 MG tablet        No facility-administered medications prior to visit.         Objective:   Physical Exam Filed Vitals:   09/05/13 0858  BP: 144/98  Pulse: 81  Height: 5' 7.5" (1.715 m)  Weight: 239 lb (108.41 kg)  SpO2: 98%   Gen: Pleasant, well-nourished, in no distress,  normal affect  ENT: No lesions,  mouth clear,  oropharynx clear, no postnasal drip  Neck: No JVD, no TMG, no carotid bruits  Lungs: No use of accessory muscles, clear without rales or rhonchi  Cardiovascular: RRR, heart sounds normal, no murmur or gallops, no peripheral  edema  Musculoskeletal: No deformities, no cyanosis or clubbing; mild R pectoral tenderness to palp.  Neuro: alert, non focal  Skin: Warm, no lesions or rashes      Assessment & Plan:  ASTHMA - pred 40 x 5 days - ultram for cough / pain - restart symbicort for now; consider stopping after this flare clears vs continuing indefinitely.  - albuterol prn - repeat spirometry when she has recovered.  - rov 1

## 2013-09-05 NOTE — Addendum Note (Signed)
Addended by: Elie Confer on: 09/05/2013 11:06 AM   Modules accepted: Orders

## 2013-09-05 NOTE — Patient Instructions (Signed)
Take prednisone 40 mg daily for 5 days and then stop Restart Symbicort 2 puffs twice a day. Please remember to rinse and gargle after using Continue albuterol 2 puffs as needed Continue your loratadine and Nasacort nose spray Continue your omeprazole Follow with Dr. Lamonte Sakai in 1 month to decide whether we will stay on Symbicort. We will discuss repeating her spirometry once this episode is cleared.

## 2013-10-17 ENCOUNTER — Ambulatory Visit (INDEPENDENT_AMBULATORY_CARE_PROVIDER_SITE_OTHER): Payer: BC Managed Care – PPO | Admitting: Emergency Medicine

## 2013-10-17 ENCOUNTER — Encounter: Payer: Self-pay | Admitting: Emergency Medicine

## 2013-10-17 VITALS — BP 130/74 | HR 83 | Ht 67.5 in | Wt 241.0 lb

## 2013-10-17 DIAGNOSIS — J45909 Unspecified asthma, uncomplicated: Secondary | ICD-10-CM

## 2013-10-17 DIAGNOSIS — J383 Other diseases of vocal cords: Secondary | ICD-10-CM

## 2013-10-17 MED ORDER — BUDESONIDE-FORMOTEROL FUMARATE 160-4.5 MCG/ACT IN AERO: 2.0000 | INHALATION_SPRAY | Freq: Two times a day (BID) | RESPIRATORY_TRACT | Status: AC

## 2013-10-17 MED ORDER — BUDESONIDE-FORMOTEROL FUMARATE 160-4.5 MCG/ACT IN AERO: 2.0000 | INHALATION_SPRAY | Freq: Two times a day (BID) | RESPIRATORY_TRACT | Status: DC

## 2013-10-17 NOTE — Progress Notes (Signed)
   Subjective:    Patient ID: Amber Stone, female    DOB: 1960/03/21, 53 y.o.   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 She has GERD and allergies. We havn't seen each other for almost 3 years. She has been doing fairly well - has had a few discrete flares that were related to triggers. She was treated for a flare over the last month with rounds of pred and abx. She is left with coughing, no-prod. She is more dyspneic than her baseline - helped by albuterol. She hears wheeze w cough. She required duonebs early in the course. Averaging albuterol 2x a day now. Her GERD is well- controlled.   ROV 10/17/13 -- follows for VCD and asthma, OSA, GERD, allergies. Last time we restarted symbicort, treated with a pred burst. She follows today to assess the symbicort.  Unfortunately she was just exposed to perfume in the lobby and is coughing, having stridor.    Review of Systems  Constitutional: Negative for fever and unexpected weight change.  HENT: Positive for postnasal drip. Negative for congestion, dental problem, ear pain, nosebleeds, rhinorrhea, sinus pressure, sneezing, sore throat and trouble swallowing.   Eyes: Negative for redness and itching.  Respiratory: Positive for cough and shortness of breath. Negative for chest tightness and wheezing.   Cardiovascular: Positive for chest pain. Negative for palpitations and leg swelling.  Gastrointestinal: Negative for nausea and vomiting.  Genitourinary: Negative for dysuria.  Musculoskeletal: Negative for joint swelling.  Skin: Negative for rash.  Neurological: Negative for headaches.  Hematological: Does not bruise/bleed easily.  Psychiatric/Behavioral: Negative for dysphoric mood. The patient is not nervous/anxious.       Objective:   Physical Exam Filed Vitals:   10/17/13 1540  BP: 130/74  Pulse: 83  Height: 5' 7.5" (1.715 m)  Weight: 241 lb (109.317  kg)  SpO2: 99%   Gen: Pleasant, well-nourished, in no distress,  normal affect  ENT: No lesions,  mouth clear,  oropharynx clear, no postnasal drip  Neck: No JVD, no TMG, no carotid bruits  Lungs: No use of accessory muscles, clear without rales or rhonchi  Cardiovascular: RRR, heart sounds normal, no murmur or gallops, no peripheral edema  Musculoskeletal: No deformities, no cyanosis or clubbing; mild R pectoral tenderness to palp.  Neuro: alert, non focal  Skin: Warm, no lesions or rashes      Assessment & Plan:  ASTHMA Seems to have clinically benefited from symbicort, would like to continue Needs full PFT   VOCAL CORD DISORDER Exacerbated today due to perfume exposure, but she doesn't seem to have stridor, don't believe she needs prednisone.

## 2013-10-17 NOTE — Patient Instructions (Signed)
We will continue Symbicort twice a day Use albuterol as needed for shortness of breath or wheezing.  We will fill out your handicap placard paperwork today.  Call our office if your coughing does not improve.  We will set up PFT's before your next visit Follow with Dr Lamonte Sakai in 3 months or sooner if you have any problems.

## 2013-10-17 NOTE — Assessment & Plan Note (Addendum)
Seems to have clinically benefited from symbicort, would like to continue Needs full PFT

## 2013-10-17 NOTE — Assessment & Plan Note (Signed)
Exacerbated today due to perfume exposure, but she doesn't seem to have stridor, don't believe she needs prednisone.

## 2013-11-07 ENCOUNTER — Encounter (HOSPITAL_COMMUNITY): Payer: BC Managed Care – PPO

## 2013-12-19 ENCOUNTER — Ambulatory Visit (HOSPITAL_COMMUNITY)
Admission: RE | Admit: 2013-12-19 | Discharge: 2013-12-19 | Disposition: A | Discharge status: Home / Self Care | Payer: BC Managed Care – PPO | Source: Ambulatory Visit | Attending: Emergency Medicine | Admitting: Emergency Medicine

## 2013-12-19 DIAGNOSIS — R918 Other nonspecific abnormal finding of lung field: Secondary | ICD-10-CM | POA: Insufficient documentation

## 2013-12-19 DIAGNOSIS — J45909 Unspecified asthma, uncomplicated: Secondary | ICD-10-CM

## 2013-12-19 DIAGNOSIS — R0609 Other forms of dyspnea: Secondary | ICD-10-CM | POA: Diagnosis not present

## 2013-12-19 LAB — PULMONARY FUNCTION TEST
DL/VA % pred: 104 %
DL/VA: 5.4 ml/min/mmHg/L
DLCO UNC % PRED: 95 %
DLCO UNC: 27.68 ml/min/mmHg
DLCO cor % pred: 95 %
DLCO cor: 27.68 ml/min/mmHg
FEF 25-75 Post: 2.95 L/sec
FEF 25-75 Pre: 3.36 L/sec
FEF2575-%Change-Post: -12 %
FEF2575-%PRED-PRE: 120 %
FEF2575-%Pred-Post: 105 %
FEV1-%CHANGE-POST: 0 %
FEV1-%PRED-PRE: 87 %
FEV1-%Pred-Post: 87 %
FEV1-Post: 2.64 L
FEV1-Pre: 2.66 L
FEV1FVC-%Change-Post: -3 %
FEV1FVC-%Pred-Pre: 107 %
FEV6-%CHANGE-POST: 2 %
FEV6-%Pred-Post: 85 %
FEV6-%Pred-Pre: 83 %
FEV6-PRE: 3.13 L
FEV6-Post: 3.22 L
FEV6FVC-%PRED-POST: 103 %
FEV6FVC-%PRED-PRE: 103 %
FVC-%Change-Post: 2 %
FVC-%PRED-POST: 82 %
FVC-%Pred-Pre: 80 %
FVC-POST: 3.22 L
FVC-PRE: 3.13 L
POST FEV1/FVC RATIO: 82 %
POST FEV6/FVC RATIO: 100 %
Pre FEV1/FVC ratio: 85 %
Pre FEV6/FVC Ratio: 100 %
RV % pred: 87 %
RV: 1.8 L
TLC % PRED: 90 %
TLC: 5.07 L

## 2013-12-19 MED ORDER — ALBUTEROL SULFATE (2.5 MG/3ML) 0.083% IN NEBU
2.5000 mg | INHALATION_SOLUTION | Freq: Once | RESPIRATORY_TRACT | Status: AC
  Administered 2013-12-19: 2.5 mg via RESPIRATORY_TRACT

## 2014-01-30 ENCOUNTER — Ambulatory Visit: Payer: BC Managed Care – PPO | Admitting: Emergency Medicine

## 2014-02-16 ENCOUNTER — Telehealth: Payer: Self-pay | Admitting: Emergency Medicine

## 2014-02-16 NOTE — Telephone Encounter (Signed)
Called and spoke to pt. Pt stated she was told she has pna by UC and was started on abx but is not feeling better. Appt made with TP on 02/17/14 at 0900. Pt verbalized understanding and denied any further questions or concerns at this time.

## 2014-02-17 ENCOUNTER — Ambulatory Visit (INDEPENDENT_AMBULATORY_CARE_PROVIDER_SITE_OTHER): Payer: BC Managed Care – PPO | Admitting: Adult Health

## 2014-02-17 ENCOUNTER — Encounter: Payer: Self-pay | Admitting: Adult Health

## 2014-02-17 ENCOUNTER — Ambulatory Visit (INDEPENDENT_AMBULATORY_CARE_PROVIDER_SITE_OTHER)
Admission: RE | Admit: 2014-02-17 | Discharge: 2014-02-17 | Disposition: A | Discharge status: Home / Self Care | Payer: BC Managed Care – PPO | Source: Ambulatory Visit | Attending: Adult Health | Admitting: Adult Health

## 2014-02-17 VITALS — BP 122/80 | HR 70 | Temp 97.8°F | Ht 67.5 in | Wt 247.0 lb

## 2014-02-17 DIAGNOSIS — J189 Pneumonia, unspecified organism: Secondary | ICD-10-CM

## 2014-02-17 DIAGNOSIS — J4531 Mild persistent asthma with (acute) exacerbation: Secondary | ICD-10-CM

## 2014-02-17 MED ORDER — HYDROCODONE-HOMATROPINE 5-1.5 MG/5ML PO SYRP: 5.0000 mL | ORAL_SOLUTION | Freq: Four times a day (QID) | ORAL | Status: DC | PRN

## 2014-02-17 NOTE — Assessment & Plan Note (Signed)
Flare with associated PNA-resolving  CXR w/ no sign of PNA  Hold on additional abx or steroids   Plan  Finish Doxycycline as planned  Mucinex DM Twice daily  As needed  Cough/congestion  Fluids and rest  Tylenol As needed   Hydromet 1 tsp every 6hrs as needed -may make you sleepy.  Follow up Dr. Lamonte Sakai  As planned next month and As needed   Please contact office for sooner follow up if symptoms do not improve or worsen or seek emergency care

## 2014-02-17 NOTE — Patient Instructions (Signed)
Finish Doxycycline as planned  Mucinex DM Twice daily  As needed  Cough/congestion  Fluids and rest  Tylenol As needed   Hydromet 1 tsp every 6hrs as needed -may make you sleepy.  Follow up Dr. Lamonte Sakai  As planned next month and As needed   Please contact office for sooner follow up if symptoms do not improve or worsen or seek emergency care

## 2014-02-17 NOTE — Progress Notes (Signed)
   Subjective:    Patient ID: Amber Stone, female    DOB: 1960/08/22, 53 y.o.   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 She has GERD and allergies. We havn't seen each other for almost 3 years. She has been doing fairly well - has had a few discrete flares that were related to triggers. She was treated for a flare over the last month with rounds of pred and abx. She is left with coughing, no-prod. She is more dyspneic than her baseline - helped by albuterol. She hears wheeze w cough. She required duonebs early in the course. Averaging albuterol 2x a day now. Her GERD is well- controlled.   ROV 10/17/13 -- follows for VCD and asthma, OSA, GERD, allergies. Last time we restarted symbicort, treated with a pred burst. She follows today to assess the symbicort.  Unfortunately she was just exposed to perfume in the lobby and is coughing, having stridor.   02/17/2014 Acute OV  Complains of productive cough , dypsnea, and congesiton for 1 week. Has low appetite , chills, and malaise .  Was seen at urgent care in [place], dx with LLL PNA per pt . No records available . Rx Doxycyline day 6/7 along with abx injection. And prednisone for 3 days.  Taking Symbicort and singulair.  CXR today shows no sign of acute PNA.  She does not feel a lot better, still weak.  Cough is keeping her up at night.  She denies any hemoptysis, chest pain, orthopnea, PND, leg swelling, calf pain, or nausea, vomiting, diarrhea  Review of Systems  Constitutional:   No  weight loss, night sweats,  Fevers,  +chills, fatigue, or  lassitude.  HEENT:   No headaches,  Difficulty swallowing,  Tooth/dental problems, or  Sore throat,                No sneezing, itching, ear ache,  +nasal congestion, post nasal drip,   CV:  No chest pain,  Orthopnea, PND, swelling in lower extremities, anasarca, dizziness, palpitations, syncope.   GI  No  heartburn, indigestion, abdominal pain, nausea, vomiting, diarrhea, change in bowel habits, loss of appetite, bloody stools.   Resp:  .  No chest wall deformity  Skin: no rash or lesions.  GU: no dysuria, change in color of urine, no urgency or frequency.  No flank pain, no hematuria   MS:  No joint pain or swelling.  No decreased range of motion.  No back pain.  Psych:  No change in mood or affect. No depression or anxiety.  No memory loss.          Objective:   Physical Exam  Gen: Pleasant, well-nourished, in no distress,  normal affect  ENT: No lesions,  mouth clear,  oropharynx clear, no postnasal drip  Neck: No JVD, no TMG, no carotid bruits  Lungs: No use of accessory muscles, clear without rales or rhonchi  Cardiovascular: RRR, heart sounds normal, no murmur or gallops, no peripheral edema  Musculoskeletal: No deformities, no cyanosis or clubbing;   Neuro: alert, non focal  Skin: Warm, no lesions or rashes      Assessment & Plan:

## 2014-03-16 ENCOUNTER — Encounter: Payer: Self-pay | Admitting: Emergency Medicine

## 2014-03-16 ENCOUNTER — Other Ambulatory Visit (INDEPENDENT_AMBULATORY_CARE_PROVIDER_SITE_OTHER): Payer: BLUE CROSS/BLUE SHIELD

## 2014-03-16 ENCOUNTER — Ambulatory Visit (INDEPENDENT_AMBULATORY_CARE_PROVIDER_SITE_OTHER): Payer: BLUE CROSS/BLUE SHIELD | Admitting: Emergency Medicine

## 2014-03-16 VITALS — BP 132/80 | HR 85 | Temp 97.2°F | Ht 67.5 in | Wt 246.0 lb

## 2014-03-16 DIAGNOSIS — R079 Chest pain, unspecified: Secondary | ICD-10-CM

## 2014-03-16 DIAGNOSIS — R0600 Dyspnea, unspecified: Secondary | ICD-10-CM

## 2014-03-16 DIAGNOSIS — J383 Other diseases of vocal cords: Secondary | ICD-10-CM

## 2014-03-16 DIAGNOSIS — R5383 Other fatigue: Secondary | ICD-10-CM

## 2014-03-16 DIAGNOSIS — R0781 Pleurodynia: Secondary | ICD-10-CM

## 2014-03-16 DIAGNOSIS — R5381 Other malaise: Secondary | ICD-10-CM

## 2014-03-16 HISTORY — DX: Chest pain, unspecified: R07.9

## 2014-03-16 LAB — CBC WITH DIFFERENTIAL/PLATELET
BASOS ABS: 0 10*3/uL (ref 0.0–0.1)
Basophils Relative: 0.2 % (ref 0.0–3.0)
Eosinophils Absolute: 0.2 10*3/uL (ref 0.0–0.7)
Eosinophils Relative: 2.5 % (ref 0.0–5.0)
HCT: 40.9 % (ref 36.0–46.0)
HEMOGLOBIN: 13.9 g/dL (ref 12.0–15.0)
Lymphocytes Relative: 28.5 % (ref 12.0–46.0)
Lymphs Abs: 2.6 10*3/uL (ref 0.7–4.0)
MCHC: 34.1 g/dL (ref 30.0–36.0)
MCV: 96.1 fl (ref 78.0–100.0)
MONO ABS: 0.9 10*3/uL (ref 0.1–1.0)
MONOS PCT: 9.2 % (ref 3.0–12.0)
NEUTROS PCT: 59.6 % (ref 43.0–77.0)
Neutro Abs: 5.5 10*3/uL (ref 1.4–7.7)
PLATELETS: 411 10*3/uL — AB (ref 150.0–400.0)
RBC: 4.26 Mil/uL (ref 3.87–5.11)
RDW: 13.6 % (ref 11.5–15.5)
WBC: 9.3 10*3/uL (ref 4.0–10.5)

## 2014-03-16 LAB — BASIC METABOLIC PANEL
BUN: 19 mg/dL (ref 6–23)
CHLORIDE: 103 meq/L (ref 96–112)
CO2: 26 mEq/L (ref 19–32)
CREATININE: 1 mg/dL (ref 0.40–1.20)
Calcium: 9.3 mg/dL (ref 8.4–10.5)
GFR: 61.17 mL/min (ref >60.00)
GLUCOSE: 105 mg/dL — AB (ref 70–99)
Potassium: 3.8 mEq/L (ref 3.5–5.1)
Sodium: 137 mEq/L (ref 135–145)

## 2014-03-16 NOTE — Assessment & Plan Note (Signed)
This is persisted for a month after she had what seemed to be a viral syndrome. She has not regained her stamina. She continues to be dyspneic and weak with any exertion.  - check TSH, CBC, CMV IgG and IgM

## 2014-03-16 NOTE — Assessment & Plan Note (Signed)
This is a new complaint over the last week. There are several possible etiologies but I feel obliged to evaluate for chronic or subacute PE.  - will perform CT-PA at [facility] to eval for PE and to assess

## 2014-03-16 NOTE — Progress Notes (Signed)
Subjective:    Patient ID: Amber Stone, female    DOB: Sep 11, 1960, 54 y.o.   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 She has GERD and allergies. We havn't seen each other for almost 3 years. She has been doing fairly well - has had a few discrete flares that were related to triggers. She was treated for a flare over the last month with rounds of pred and abx. She is left with coughing, no-prod. She is more dyspneic than her baseline - helped by albuterol. She hears wheeze w cough. She required duonebs early in the course. Averaging albuterol 2x a day now. Her GERD is well- controlled.   ROV 55/21/15 -- follows for VCD and asthma, OSA, GERD, allergies. Last time we restarted symbicort, treated with a pred burst. She follows today to assess the symbicort.  Unfortunately she was just exposed to perfume in the lobby and is coughing, having stridor.   Acute OV  Complains of productive cough , dypsnea, and congesiton for 1 week. Has low appetite , chills, and malaise .  Was seen at urgent care in [place], dx with LLL PNA per pt . No records available . Rx Doxycyline day 6/7 along with abx injection. And prednisone for 3 days.  Taking Symbicort and singulair.  CXR today shows no sign of acute PNA.  She does not feel a lot better, still weak.  Cough is keeping her up at night.  She denies any hemoptysis, chest pain, orthopnea, PND, leg swelling, calf pain, or nausea, vomiting, diarrhea  ROV 55/18/15 for VCD + asthma, impacted by her GERD and allergies. She had a flare end of Dec >> she was treated with minocycline and prednisone. She is actually been sick for the entire month complaining of malaise, nonproductive cough, weakness, hoarse voice. She has had minimal congestion. She also states that she's had some inspiratory right-sided focal chest pain for the last week. Prednisone has made her  dyspnea, cough and shortness of breath temporarily better.    Review of Systems  Constitutional:   No  weight loss, night sweats,  Fevers,  +chills, fatigue, or  lassitude.         Objective:   Physical Exam Filed Vitals:   03/16/14 1651  BP: 132/80  Pulse: 85  Temp: 97.2 F (36.2 C)  TempSrc: Oral  Height: 5' 7.5" (1.715 m)  Weight: 246 lb (111.585 kg)  SpO2: 97%    Gen: Pleasant, well-nourished, in no distress, fatigued  ENT: posterior pharyngeal erythema  Neck: No JVD, no TMG, no carotid bruits  Lungs: No use of accessory muscles, clear without rales or rhonchi  Cardiovascular: RRR, heart sounds normal, no murmur or gallops, no peripheral edema  Musculoskeletal: No deformities, no cyanosis or clubbing;   Neuro: alert, non focal  Skin: Warm, no lesions or rashes      Assessment & Plan:   Chest pain This is a new complaint over the last week. There are several possible etiologies but I feel obliged to evaluate for chronic or subacute PE.  - will perform CT[facility]PA at [facility] to eval for PE and to assess    Vocal cord dysfunction With associated dyspnea and hoarseness. He does not have stridor and I do not believe she needs any further treatment for this other than her maintenance regimen   Malaise and fatigue This is persisted for a month  after she had what seemed to be a viral syndrome. She has not regained her stamina. She continues to be dyspneic and weak with any exertion.  - check TSH, CBC, CMV IgG and IgM

## 2014-03-16 NOTE — Patient Instructions (Addendum)
Will have a CT Chest Angio at [facility] today Finish antibiotic Follow up with Tammy, NP in 1 week

## 2014-03-16 NOTE — Assessment & Plan Note (Signed)
With associated dyspnea and hoarseness. He does not have stridor and I do not believe she needs any further treatment for this other than her maintenance regimen

## 2014-03-17 ENCOUNTER — Ambulatory Visit: Payer: BC Managed Care – PPO | Admitting: Emergency Medicine

## 2014-03-17 ENCOUNTER — Encounter (HOSPITAL_COMMUNITY): Payer: Self-pay

## 2014-03-17 ENCOUNTER — Ambulatory Visit (HOSPITAL_COMMUNITY)
Admission: RE | Admit: 2014-03-17 | Discharge: 2014-03-17 | Disposition: A | Discharge status: Home / Self Care | Payer: BLUE CROSS/BLUE SHIELD | Source: Ambulatory Visit | Attending: Emergency Medicine | Admitting: Emergency Medicine

## 2014-03-17 DIAGNOSIS — R079 Chest pain, unspecified: Secondary | ICD-10-CM | POA: Diagnosis not present

## 2014-03-17 DIAGNOSIS — R0600 Dyspnea, unspecified: Secondary | ICD-10-CM

## 2014-03-17 DIAGNOSIS — R0602 Shortness of breath: Secondary | ICD-10-CM | POA: Insufficient documentation

## 2014-03-17 LAB — TSH: TSH: 0.68 u[IU]/mL (ref 0.35–4.50)

## 2014-03-17 MED ORDER — IOHEXOL 350 MG/ML SOLN
100.0000 mL | Freq: Once | INTRAVENOUS | Status: AC | PRN
  Administered 2014-03-17: 100 mL via INTRAVENOUS

## 2014-03-18 LAB — CYTOMEGALOVIRUS ANTIBODY, IGG: Cytomegalovirus Ab-IgG: >10 U/mL — ABNORMAL HIGH (ref <0.60)

## 2014-03-18 LAB — CMV IGM: CMV IgM: 24.5 AU/mL (ref <30.00)

## 2014-03-23 ENCOUNTER — Ambulatory Visit (INDEPENDENT_AMBULATORY_CARE_PROVIDER_SITE_OTHER): Payer: BLUE CROSS/BLUE SHIELD | Admitting: Adult Health

## 2014-03-23 ENCOUNTER — Encounter: Payer: Self-pay | Admitting: Adult Health

## 2014-03-23 VITALS — BP 132/70 | HR 71 | Temp 97.7°F | Ht 67.5 in | Wt 250.0 lb

## 2014-03-23 DIAGNOSIS — K219 Gastro-esophageal reflux disease without esophagitis: Secondary | ICD-10-CM

## 2014-03-23 DIAGNOSIS — R5381 Other malaise: Secondary | ICD-10-CM

## 2014-03-23 DIAGNOSIS — J4531 Mild persistent asthma with (acute) exacerbation: Secondary | ICD-10-CM

## 2014-03-23 DIAGNOSIS — R5383 Other fatigue: Secondary | ICD-10-CM

## 2014-03-23 NOTE — Patient Instructions (Signed)
Continue on current regimen  Follow up Dr. Lamonte Sakai  In 3 months and As needed

## 2014-03-23 NOTE — Progress Notes (Signed)
Subjective:    Patient ID: Amber Stone, female    DOB: Sep 26, 1960, 54 y.o.   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 She has GERD and allergies. We havn't seen each other for almost 3 years. She has been doing fairly well - has had a few discrete flares that were related to triggers. She was treated for a flare over the last month with rounds of pred and abx. She is left with coughing, no-prod. She is more dyspneic than her baseline - helped by albuterol. She hears wheeze w cough. She required duonebs early in the course. Averaging albuterol 2x a day now. Her GERD is well- controlled.   ROV 10/17/13 -- follows for VCD and asthma, OSA, GERD, allergies. Last time we restarted symbicort, treated with a pred burst. She follows today to assess the symbicort.  Unfortunately she was just exposed to perfume in the lobby and is coughing, having stridor.   Acute OV  Complains of productive cough , dypsnea, and congesiton for 1 week. Has low appetite , chills, and malaise .  Was seen at urgent care in [place], dx with LLL PNA per pt . No records available . Rx Doxycyline day 6/7 along with abx injection. And prednisone for 3 days.  Taking Symbicort and singulair.  CXR today shows no sign of acute PNA.  She does not feel a lot better, still weak.  Cough is keeping her up at night.  She denies any hemoptysis, chest pain, orthopnea, PND, leg swelling, calf pain, or nausea, vomiting, diarrhea  ROV 03/16/13 -- follow up visit for VCD + asthma, impacted by her GERD and allergies. She had a flare end of Dec >> she was treated with minocycline and prednisone. She is actually been sick for the entire month complaining of malaise, nonproductive cough, weakness, hoarse voice. She has had minimal congestion. She also states that she's had some inspiratory right-sided focal chest pain for the last week. Prednisone has made her  dyspnea, cough and shortness of breath temporarily better.  >>labs and CT chest   03/23/2014 Follow up VCD/Asthma c/by GERD and AR  Patient returns for one-week follow-up She's been having difficulty with a slow to resolve asthmatic bronchitic exacerbation . She's been treated with antibiotics and steroids . CT chest was done on January 19 that showed no evidence of pulmonary emboli, a stable right middle lobe pulmonary nodule over the last several years. No acute findings were noted. Lab work was unrevealing with a normal white blood cell count, TSH. 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 She is starting to feel improved with decreased cough, congestion. She continues to have some fatigue, but this is improving as well. Patient denies any hemoptysis, orthopnea, PND or leg swelling   Review of Systems  Constitutional:   No  weight loss, night sweats,  Fevers, chills,  +fatigue, or  lassitude.  HEENT:   No headaches,  Difficulty swallowing,  Tooth/dental problems, or  Sore throat,                No sneezing, itching, ear ache,  +nasal congestion, post nasal drip,   CV:  No chest pain,  Orthopnea, PND, swelling in lower extremities, anasarca, dizziness, palpitations, syncope.   GI  No heartburn, indigestion, abdominal pain, nausea, vomiting, diarrhea, change in bowel habits, loss of appetite, bloody  stools.   Resp:    No chest wall deformity  Skin: no rash or lesions.  GU: no dysuria, change in color of urine, no urgency or frequency.  No flank pain, no hematuria   MS:  No joint pain or swelling.  No decreased range of motion.  No back pain.  Psych:  No change in mood or affect. No depression or anxiety.  No memory loss.             Objective:   Physical Exam Filed Vitals:   03/23/14 1602  BP: 132/70  Pulse: 71  Temp: 97.7 F (36.5 C)  TempSrc: Oral  Height: 5' 7.5" (1.715 m)  Weight: 250 lb (113.399  kg)  SpO2: 97%    Gen: Pleasant, well-nourished, in no distress  ENT: clear   Neck: No JVD, no TMG, no carotid bruits  Lungs: No use of accessory muscles, clear without rales or rhonchi  Cardiovascular: RRR, heart sounds normal, no murmur or gallops, no peripheral edema  Musculoskeletal: No deformities, no cyanosis or clubbing;   Neuro: alert, non focal  Skin: Warm, no lesions or rashes      Assessment & Plan:

## 2014-03-23 NOTE — Assessment & Plan Note (Signed)
Controlled on current regimen  Plan  No changes

## 2014-03-23 NOTE — Assessment & Plan Note (Signed)
Slow to resolve exacerbation, now resolving CT and lab work were reviewed with patient She is to continue on her current regimen Follow back with Dr. Lamonte Sakai in 3 months and as needed

## 2014-03-23 NOTE — Assessment & Plan Note (Signed)
Patient's continue on her current regimen. If symptoms persist will need to follow with her primary care physician. Lab work was reviewed with PT  CMV titers were neg for IgM but positive IgG ? Past infection exposure

## 2014-08-17 DIAGNOSIS — K580 Irritable bowel syndrome with diarrhea: Secondary | ICD-10-CM | POA: Insufficient documentation

## 2014-08-17 DIAGNOSIS — J4520 Mild intermittent asthma, uncomplicated: Secondary | ICD-10-CM | POA: Insufficient documentation

## 2014-09-03 DIAGNOSIS — E785 Hyperlipidemia, unspecified: Secondary | ICD-10-CM | POA: Insufficient documentation

## 2014-09-07 ENCOUNTER — Encounter (INDEPENDENT_AMBULATORY_CARE_PROVIDER_SITE_OTHER): Payer: Self-pay | Admitting: *Deleted

## 2014-09-24 ENCOUNTER — Other Ambulatory Visit (INDEPENDENT_AMBULATORY_CARE_PROVIDER_SITE_OTHER): Payer: Self-pay | Admitting: *Deleted

## 2014-09-24 ENCOUNTER — Encounter (INDEPENDENT_AMBULATORY_CARE_PROVIDER_SITE_OTHER): Payer: Self-pay | Admitting: *Deleted

## 2014-09-24 DIAGNOSIS — Z1211 Encounter for screening for malignant neoplasm of colon: Secondary | ICD-10-CM

## 2014-11-27 ENCOUNTER — Telehealth (INDEPENDENT_AMBULATORY_CARE_PROVIDER_SITE_OTHER): Payer: Self-pay | Admitting: *Deleted

## 2014-11-27 DIAGNOSIS — Z1211 Encounter for screening for malignant neoplasm of colon: Secondary | ICD-10-CM

## 2014-11-27 NOTE — Telephone Encounter (Signed)
Patient needs trilyte 

## 2014-12-01 MED ORDER — PEG 3350-KCL-NA BICARB-NACL 420 G PO SOLR: 4000.0000 mL | Freq: Once | ORAL | Status: DC

## 2014-12-09 ENCOUNTER — Telehealth (INDEPENDENT_AMBULATORY_CARE_PROVIDER_SITE_OTHER): Payer: Self-pay | Admitting: *Deleted

## 2014-12-09 NOTE — Telephone Encounter (Signed)
Referring MD/PCP: nyland   Procedure: tcs  Reason/Indication:  screening  Has patient had this procedure before?  no  If so, when, by whom and where?    Is there a family history of colon cancer?  no  Who?  What age when diagnosed?    Is patient diabetic?   no      Does patient have prosthetic heart valve?  no  Do you have a pacemaker?  no  Has patient ever had endocarditis? no  Has patient had joint replacement within last 12 months?  no  Does patient tend to be constipated or take laxatives? no  Does patient have a history of alcohol/drug use? no  Is patient on Coumadin, Plavix and/or Aspirin? no  Medications: singulair 10 mg daily, omeprazole 40 mg daily, pravastatin 40 mg daily, zyrtec 10 mg daily, multi vit dailt, vit d, dicyclomine 20 mg bid, albuterol prn, symbicort prn  Allergies: nkda  Medication Adjustment:   Procedure date & time: 12/30/14 at 730

## 2014-12-10 NOTE — Telephone Encounter (Signed)
agree

## 2014-12-30 ENCOUNTER — Ambulatory Visit (HOSPITAL_COMMUNITY)
Admission: RE | Admit: 2014-12-30 | Discharge: 2014-12-30 | Disposition: A | Discharge status: Home / Self Care | Payer: BLUE CROSS/BLUE SHIELD | Source: Ambulatory Visit | Attending: Internal Medicine | Admitting: Internal Medicine

## 2014-12-30 ENCOUNTER — Encounter (HOSPITAL_COMMUNITY)
Admission: RE | Disposition: A | Discharge status: Home / Self Care | Payer: Self-pay | Source: Ambulatory Visit | Attending: Internal Medicine

## 2014-12-30 ENCOUNTER — Encounter (HOSPITAL_COMMUNITY): Payer: Self-pay | Admitting: *Deleted

## 2014-12-30 DIAGNOSIS — K644 Residual hemorrhoidal skin tags: Secondary | ICD-10-CM | POA: Diagnosis not present

## 2014-12-30 DIAGNOSIS — Z803 Family history of malignant neoplasm of breast: Secondary | ICD-10-CM | POA: Diagnosis not present

## 2014-12-30 DIAGNOSIS — Z79899 Other long term (current) drug therapy: Secondary | ICD-10-CM | POA: Insufficient documentation

## 2014-12-30 DIAGNOSIS — G4733 Obstructive sleep apnea (adult) (pediatric): Secondary | ICD-10-CM | POA: Diagnosis not present

## 2014-12-30 DIAGNOSIS — K219 Gastro-esophageal reflux disease without esophagitis: Secondary | ICD-10-CM | POA: Insufficient documentation

## 2014-12-30 DIAGNOSIS — K5730 Diverticulosis of large intestine without perforation or abscess without bleeding: Secondary | ICD-10-CM | POA: Insufficient documentation

## 2014-12-30 DIAGNOSIS — Z8249 Family history of ischemic heart disease and other diseases of the circulatory system: Secondary | ICD-10-CM | POA: Insufficient documentation

## 2014-12-30 DIAGNOSIS — Z806 Family history of leukemia: Secondary | ICD-10-CM | POA: Diagnosis not present

## 2014-12-30 DIAGNOSIS — D123 Benign neoplasm of transverse colon: Secondary | ICD-10-CM | POA: Diagnosis not present

## 2014-12-30 DIAGNOSIS — K648 Other hemorrhoids: Secondary | ICD-10-CM | POA: Diagnosis not present

## 2014-12-30 DIAGNOSIS — J45909 Unspecified asthma, uncomplicated: Secondary | ICD-10-CM | POA: Diagnosis not present

## 2014-12-30 DIAGNOSIS — Z1211 Encounter for screening for malignant neoplasm of colon: Secondary | ICD-10-CM | POA: Diagnosis not present

## 2014-12-30 HISTORY — PX: COLONOSCOPY: SHX5424

## 2014-12-30 SURGERY — COLONOSCOPY
Anesthesia: Moderate Sedation

## 2014-12-30 MED ORDER — MIDAZOLAM HCL 5 MG/5ML IJ SOLN
INTRAMUSCULAR | Status: DC | PRN
  Administered 2014-12-30 (×3): 2 mg via INTRAVENOUS
  Administered 2014-12-30: 1 mg via INTRAVENOUS

## 2014-12-30 MED ORDER — MEPERIDINE HCL 50 MG/ML IJ SOLN
INTRAMUSCULAR | Status: DC | PRN
  Administered 2014-12-30 (×2): 25 mg via INTRAVENOUS

## 2014-12-30 MED ORDER — MEPERIDINE HCL 50 MG/ML IJ SOLN
INTRAMUSCULAR | Status: AC
  Filled 2014-12-30: qty 1

## 2014-12-30 MED ORDER — SODIUM CHLORIDE 0.9 % IV SOLN
INTRAVENOUS | Status: DC
  Administered 2014-12-30: 07:00:00 via INTRAVENOUS

## 2014-12-30 MED ORDER — MIDAZOLAM HCL 5 MG/5ML IJ SOLN
INTRAMUSCULAR | Status: AC
  Filled 2014-12-30: qty 10

## 2014-12-30 MED ORDER — STERILE WATER FOR IRRIGATION IR SOLN
Status: DC | PRN
  Administered 2014-12-30: 08:00:00

## 2014-12-30 NOTE — Discharge Instructions (Signed)
Resume usual medications and high fiber diet. No driving for 24 hours. Physician will call with biopsy results. Colonoscopy, Care After Refer to this sheet in the next few weeks. These instructions provide you with information on caring for yourself after your procedure. Your health care provider may also give you more specific instructions. Your treatment has been planned according to current medical practices, but problems sometimes occur. Call your health care provider if you have any problems or questions after your procedure. WHAT TO EXPECT AFTER THE PROCEDURE  After your procedure, it is typical to have the following:  A small amount of blood in your stool.  Moderate amounts of gas and mild abdominal cramping or bloating. HOME CARE INSTRUCTIONS  Do not drive, operate machinery, or sign important documents for 24 hours.  You may shower and resume your regular physical activities, but move at a slower pace for the first 24 hours.  Take frequent rest periods for the first 24 hours.  Walk around or put a warm pack on your abdomen to help reduce abdominal cramping and bloating.  Drink enough fluids to keep your urine clear or pale yellow.  You may resume your normal diet as instructed by your health care provider. Avoid heavy or fried foods that are hard to digest.  Avoid drinking alcohol for 24 hours or as instructed by your health care provider.  Only take over-the-counter or prescription medicines as directed by your health care provider.  If a tissue sample (biopsy) was taken during your procedure:  Do not take aspirin or blood thinners for 7 days, or as instructed by your health care provider.  Do not drink alcohol for 7 days, or as instructed by your health care provider.  Eat soft foods for the first 24 hours. SEEK MEDICAL CARE IF: You have persistent spotting of blood in your stool 2-3 days after the procedure. SEEK IMMEDIATE MEDICAL CARE IF:  You have more than a  small spotting of blood in your stool.  You pass large blood clots in your stool.  Your abdomen is swollen (distended).  You have nausea or vomiting.  You have a fever.  You have increasing abdominal pain that is not relieved with medicine.   This information is not intended to replace advice given to you by your health care provider. Make sure you discuss any questions you have with your health care provider.   Document Released: 09/28/2003 Document Revised: 12/04/2012 Document Reviewed: 10/21/2012 Elsevier Interactive Patient Education 2016 Elsevier Inc. Colon Polyps Polyps are lumps of extra tissue growing inside the body. Polyps can grow in the large intestine (colon). Most colon polyps are noncancerous (benign). However, some colon polyps can become cancerous over time. Polyps that are larger than a pea may be harmful. To be safe, caregivers remove and test all polyps. CAUSES  Polyps form when mutations in the genes cause your cells to grow and divide even though no more tissue is needed. RISK FACTORS There are a number of risk factors that can increase your chances of getting colon polyps. They include:  Being older than 50 years.  Family history of colon polyps or colon cancer.  Long-term colon diseases, such as colitis or Crohn disease.  Being overweight.  Smoking.  Being inactive.  Drinking too much alcohol. SYMPTOMS  Most small polyps do not cause symptoms. If symptoms are present, they may include:  Blood in the stool. The stool may look dark red or black.  Constipation or diarrhea that lasts longer  than 1 week. DIAGNOSIS People often do not know they have polyps until their caregiver finds them during a regular checkup. Your caregiver can use 4 tests to check for polyps:  Digital rectal exam. The caregiver wears gloves and feels inside the rectum. This test would find polyps only in the rectum.  Barium enema. The caregiver puts a liquid called barium into  your rectum before taking X-rays of your colon. Barium makes your colon look white. Polyps are dark, so they are easy to see in the X-ray pictures.  Sigmoidoscopy. A thin, flexible tube (sigmoidoscope) is placed into your rectum. The sigmoidoscope has a light and tiny camera in it. The caregiver uses the sigmoidoscope to look at the last third of your colon.  Colonoscopy. This test is like sigmoidoscopy, but the caregiver looks at the entire colon. This is the most common method for finding and removing polyps. TREATMENT  Any polyps will be removed during a sigmoidoscopy or colonoscopy. The polyps are then tested for cancer. PREVENTION  To help lower your risk of getting more colon polyps:  Eat plenty of fruits and vegetables. Avoid eating fatty foods.  Do not smoke.  Avoid drinking alcohol.  Exercise every day.  Lose weight if recommended by your caregiver.  Eat plenty of calcium and folate. Foods that are rich in calcium include milk, cheese, and broccoli. Foods that are rich in folate include chickpeas, kidney beans, and spinach. HOME CARE INSTRUCTIONS Keep all follow-up appointments as directed by your caregiver. You may need periodic exams to check for polyps. SEEK MEDICAL CARE IF: You notice bleeding during a bowel movement.   This information is not intended to replace advice given to you by your health care provider. Make sure you discuss any questions you have with your health care provider.   Document Released: 11/10/2003 Document Revised: 03/06/2014 Document Reviewed: 04/25/2011 Elsevier Interactive Patient Education 2016 Elsevier Inc. High-Fiber Diet Fiber, also called dietary fiber, is a type of carbohydrate found in fruits, vegetables, whole grains, and beans. A high-fiber diet can have many health benefits. Your health care provider may recommend a high-fiber diet to help:  Prevent constipation. Fiber can make your bowel movements more regular.  Lower your  cholesterol.  Relieve hemorrhoids, uncomplicated diverticulosis, or irritable bowel syndrome.  Prevent overeating as part of a weight-loss plan.  Prevent heart disease, type 2 diabetes, and certain cancers. WHAT IS MY PLAN? The recommended daily intake of fiber includes:  38 grams for men under age 68.  13 grams for men over age 1.  13 grams for women under age 28.  25 grams for women over age 40. You can get the recommended daily intake of dietary fiber by eating a variety of fruits, vegetables, grains, and beans. Your health care provider may also recommend a fiber supplement if it is not possible to get enough fiber through your diet. WHAT DO I NEED TO KNOW ABOUT A HIGH-FIBER DIET?  Fiber supplements have not been widely studied for their effectiveness, so it is better to get fiber through food sources.  Always check the fiber content on thenutrition facts label of any prepackaged food. Look for foods that contain at least 5 grams of fiber per serving.  Ask your dietitian if you have questions about specific foods that are related to your condition, especially if those foods are not listed in the following section.  Increase your daily fiber consumption gradually. Increasing your intake of dietary fiber too quickly may cause bloating, cramping,  or gas.  Drink plenty of water. Water helps you to digest fiber. WHAT FOODS CAN I EAT? Grains Whole-grain breads. Multigrain cereal. Oats and oatmeal. Brown rice. Barley. Bulgur wheat. [place]. Bran muffins. Popcorn. Rye wafer crackers. Vegetables Sweet potatoes. Spinach. Kale. Artichokes. Cabbage. Broccoli. Green peas. Carrots. Squash. Fruits Berries. Pears. Apples. Oranges. Avocados. Prunes and raisins. Dried figs. Meats and Other Protein Sources [place], kidney, pinto, and soy beans. Split peas. Lentils. Nuts and seeds. Dairy Fiber-fortified yogurt. Beverages Fiber-fortified soy milk. Fiber-fortified orange juice. Other Fiber  bars. The items listed above may not be a complete list of recommended foods or beverages. Contact your dietitian for more options. WHAT FOODS ARE NOT RECOMMENDED? Grains White bread. Pasta made with refined flour. White rice. Vegetables Fried potatoes. Canned vegetables. Well-cooked vegetables.  Fruits Fruit juice. Cooked, strained fruit. Meats and Other Protein Sources Fatty cuts of meat. Fried Sales executive or fried fish. Dairy Milk. Yogurt. Cream cheese. Sour cream. Beverages Soft drinks. Other Cakes and pastries. Butter and oils. The items listed above may not be a complete list of foods and beverages to avoid. Contact your dietitian for more information. WHAT ARE SOME TIPS FOR INCLUDING HIGH-FIBER FOODS IN MY DIET?  Eat a wide variety of high-fiber foods.  Make sure that half of all grains consumed each day are whole grains.  Replace breads and cereals made from refined flour or white flour with whole-grain breads and cereals.  Replace white rice with brown rice, bulgur wheat, or millet.  Start the day with a breakfast that is high in fiber, such as a cereal that contains at least 5 grams of fiber per serving.  Use beans in place of meat in soups, salads, or pasta.  Eat high-fiber snacks, such as berries, raw vegetables, nuts, or popcorn.   This information is not intended to replace advice given to you by your health care provider. Make sure you discuss any questions you have with your health care provider.   Document Released: 02/13/2005 Document Revised: 03/06/2014 Document Reviewed: 07/29/2013 Elsevier Interactive Patient Education [employer].

## 2014-12-30 NOTE — Op Note (Signed)
COLONOSCOPY PROCEDURE REPORT  PATIENT:  Amber Stone  MR#:  [id number] Birthdate:  1960-04-11, 54 y.o., female Endoscopist:  Dr. Rogene Houston, MD Referred By:  Dr. Sherrie Mustache, MD  Procedure Date: 12/30/2014  Procedure:   Colonoscopy  Indications:  Patient is 54 year old Caucasian female was undergoing average risk screening colonoscopy.  Informed Consent:  The procedure and risks were reviewed with the patient and informed consent was obtained.  Medications:  Demerol 50 mg IV Versed 7 mg IV  Description of procedure:  After a digital rectal exam was performed, that colonoscope was advanced from the anus through the rectum and colon to the area of the cecum, ileocecal valve and appendiceal orifice. The cecum was deeply intubated. These structures were well-seen and photographed for the record. From the level of the cecum and ileocecal valve, the scope was slowly and cautiously withdrawn. The mucosal surfaces were carefully surveyed utilizing scope tip to flexion to facilitate fold flattening as needed. The scope was pulled down into the rectum where a thorough exam including retroflexion was performed.  Findings:   Prep excellent. Small polyp ablated via cold biopsy from hepatic flexure. 5 mm polyp cold snared from proximal transverse colon. Both of these polyps were submitted together. Few small diverticula at sigmoid colon. Normal rectal mucosa. Small hemorrhoids below the dentate line.   Therapeutic/Diagnostic Maneuvers Performed:  See above  Complications:  None  EBL: None  Cecal Withdrawal Time:  11 minutes  Impression:  Examination performed to cecum. Two small polyps removed and submitted together(polyp at hepatic flexure removed with cold biopsy forceps and polyp at transverse colon was cold snared). Mild sigmoid colon diverticulosis. Small external hemorrhoids.  Recommendations:  Standard instructions given. High fiber diet I will contact patient  with biopsy results and further recommendations.  Pema Thomure U  12/30/2014 8:17 AM  CC: Dr. Sherrie Mustache, MD & Dr. Rayne Du ref. provider found

## 2014-12-30 NOTE — H&P (Signed)
Amber Stone is an 54 y.o. female.   Chief Complaint: Patient is here for colonoscopy. HPI: Patient is 54 year old Caucasian female who is here for screening colonoscopy. She denies abdominal pain change in bowel habits or rectal bleeding. Family history is negative for CRC.  Past Medical History  Diagnosis Date  . Asthma   . GERD (gastroesophageal reflux disease)   . Allergic rhinitis   . OSA (obstructive sleep apnea)   . Disorder of vocal cords     Past Surgical History  Procedure Laterality Date  . Tonsillectomy  1978  . Ganglion cyst excision      left hand  . Nasal sinus surgery    . Lipoma excision      lower back  . Lasik  1997  . Cholecystectomy    . Breast surgery      bx right side    Family History  Problem Relation Age of Onset  . Emphysema    . Coronary artery disease Mother   . Rheum arthritis Mother   . Coronary artery disease Father   . Heart disease Father   . Cancer Paternal Aunt     breast  . Cancer Maternal Grandfather     leukemia  . Cancer Paternal Grandmother     breast   Social History:  reports that she has never smoked. She has never used smokeless tobacco. She reports that she does not drink alcohol or use illicit drugs.  Allergies: No Known Allergies  Medications Prior to Admission  Medication Sig Dispense Refill  . cetirizine (ZYRTEC) 10 MG tablet Take 10 mg by mouth daily.    . Cholecalciferol (VITAMIN D-3) 5000 UNITS TABS Take by mouth daily.     . Fish Oil OIL by Does not apply route. ON HOLD    . montelukast (SINGULAIR) 10 MG tablet Take 10 mg by mouth at bedtime.      . Multiple Vitamin (MULTIVITAMIN) capsule Take 1 capsule by mouth daily.    Marland Kitchen omeprazole (PRILOSEC) 20 MG capsule Take 20 mg by mouth daily.      . polyethylene glycol-electrolytes (NULYTELY/GOLYTELY) 420 G solution Take 4,000 mLs by mouth once. 4000 mL 0  . pravastatin (PRAVACHOL) 40 MG tablet Take 40 mg by mouth daily.     Marland Kitchen albuterol (PROAIR HFA) 108 (90  BASE) MCG/ACT inhaler Inhale 2 puffs into the lungs every 4 (four) hours as needed.      . budesonide-formoterol (SYMBICORT) 160-4.5 MCG/ACT inhaler Inhale 2 puffs into the lungs 2 (two) times daily. (Patient taking differently: Inhale 2 puffs into the lungs 2 (two) times daily as needed (shortness of breath). ) 3 Inhaler 3  . ipratropium-albuterol (DUONEB) 0.5-2.5 (3) MG/3ML SOLN Inhale 3 mLs into the lungs every 6 (six) hours as needed (shortness of breath wheezing).       No results found for this or any previous visit (from the past 48 hour(s)). No results found.  ROS  Blood pressure 132/76, pulse 58, temperature 97.6 F (36.4 C), temperature source Oral, resp. rate 15, SpO2 99 %. Physical Exam  Constitutional: She appears well-developed and well-nourished.  HENT:  Mouth/Throat: Oropharynx is clear and moist.  Eyes: Conjunctivae are normal. No scleral icterus.  Neck: No thyromegaly present.  Cardiovascular: Normal rate, regular rhythm and normal heart sounds.   No murmur heard. Respiratory: Effort normal and breath sounds normal.  GI: Soft. She exhibits no distension and no mass. There is no tenderness.  Musculoskeletal: She exhibits no edema.  Lymphadenopathy:    She has no cervical adenopathy.  Neurological: She is alert.  Skin: Skin is warm and dry.     Assessment/Plan Average risk screening colonoscopy.  Amber Stone U 12/30/2014, 7:30 AM

## 2015-01-13 ENCOUNTER — Encounter (HOSPITAL_COMMUNITY): Payer: Self-pay | Admitting: Internal Medicine

## 2016-01-25 DIAGNOSIS — I10 Essential (primary) hypertension: Secondary | ICD-10-CM | POA: Insufficient documentation

## 2016-07-09 IMAGING — CR DG CHEST 2V
2 series · 2 of 2 positions shown · non-contrast
Comparison: [DATE] [DATE], [DATE] ; [DATE] [DATE], [DATE]

CLINICAL DATA: Cough and difficulty breathing

EXAM:
CHEST  2 VIEW

[view not recorded (1 of 2)]
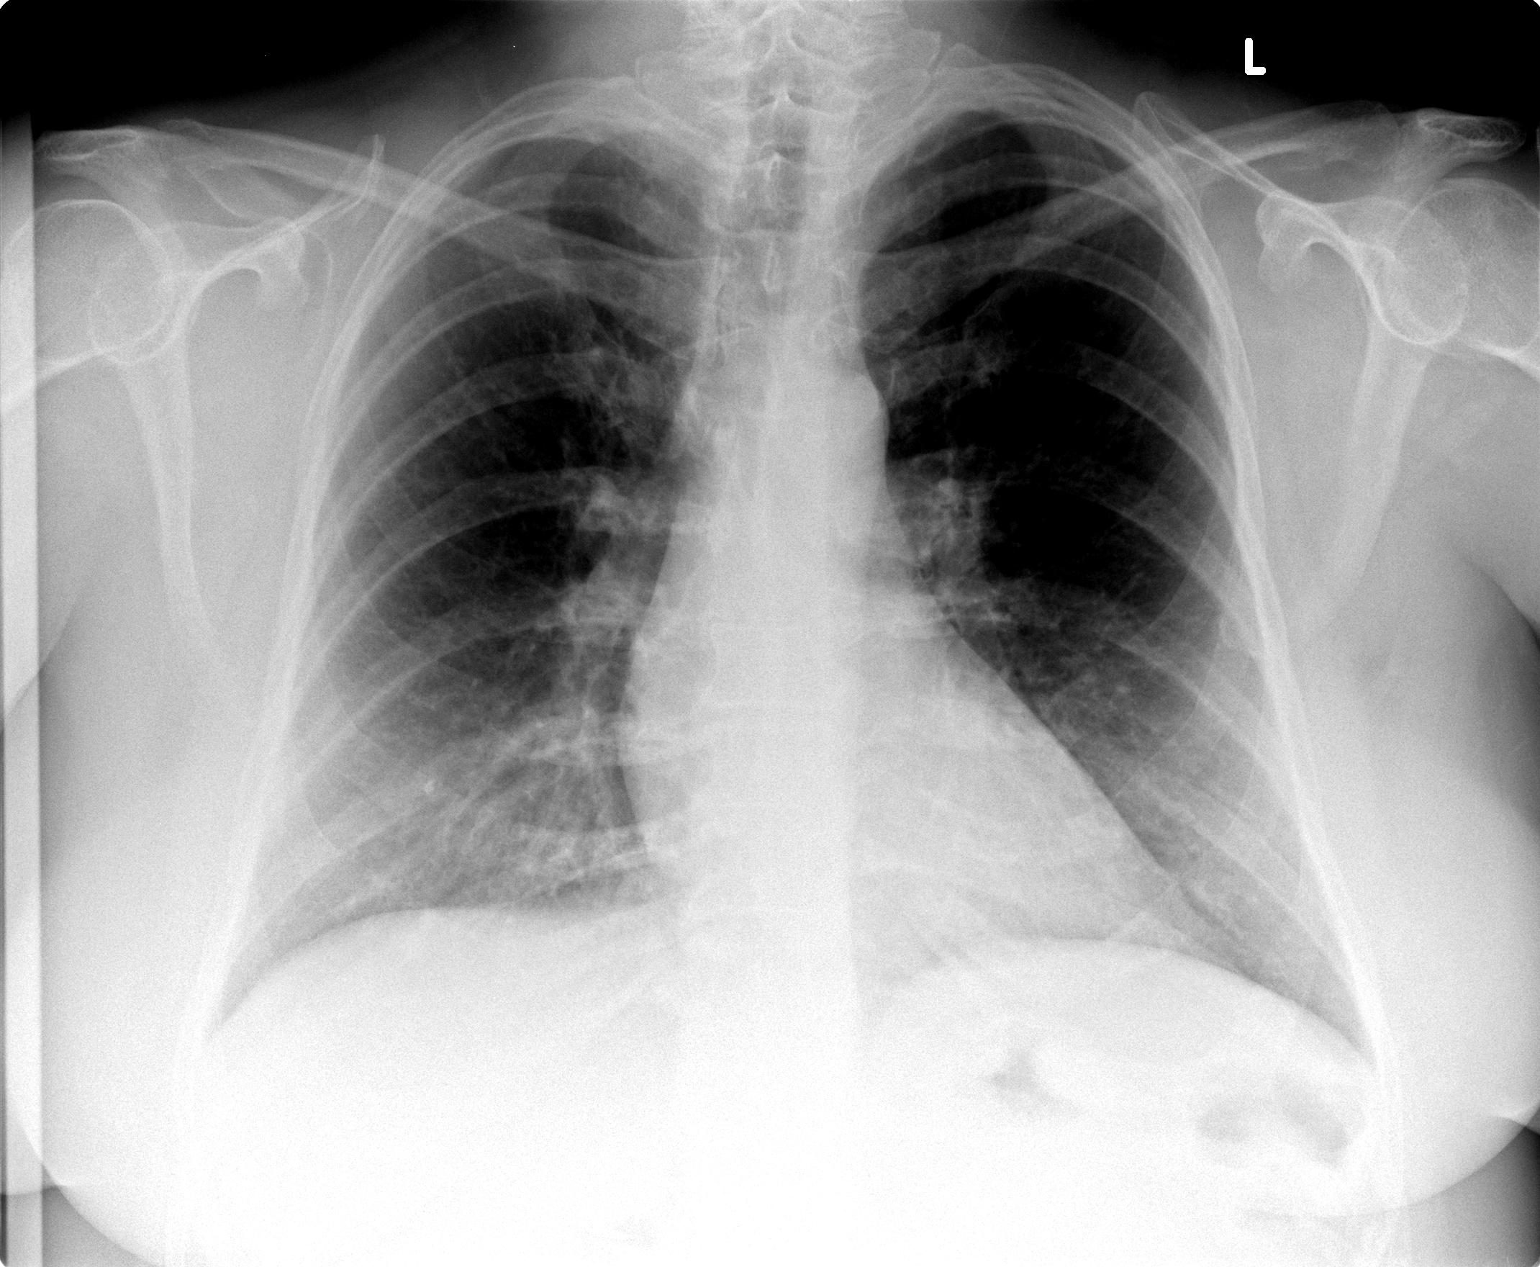

[view not recorded (2 of 2)]
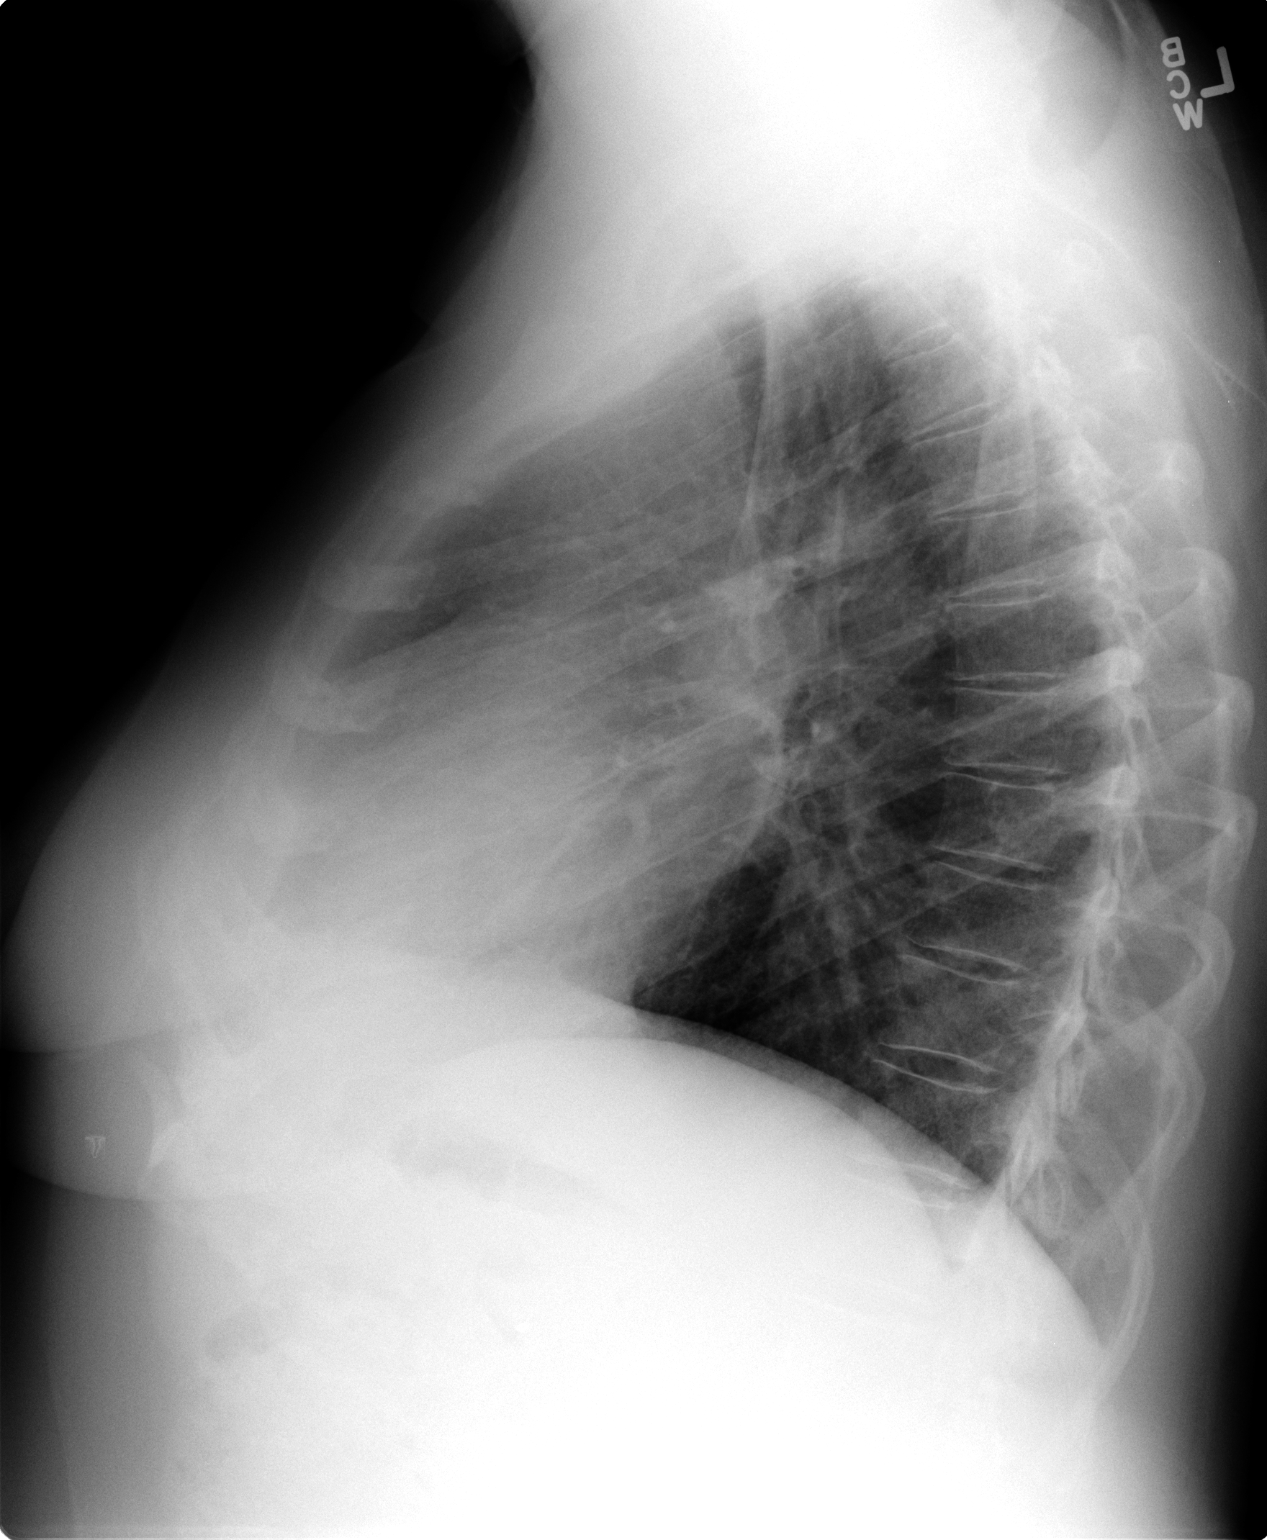

[2 of 2 positions shown; findings below may reference images not displayed]

FINDINGS: There is no edema or consolidation. The heart size and pulmonary
vascularity are normal. No adenopathy. No bone lesions.
IMPRESSION: No edema or consolidation.

## 2018-12-05 ENCOUNTER — Telehealth: Payer: BLUE CROSS/BLUE SHIELD | Admitting: Nutrition

## 2018-12-10 ENCOUNTER — Encounter: Payer: Self-pay | Admitting: Nutrition

## 2018-12-10 ENCOUNTER — Encounter: Payer: Managed Care, Other (non HMO) | Attending: Family Medicine | Admitting: Nutrition

## 2018-12-10 ENCOUNTER — Other Ambulatory Visit: Payer: Self-pay

## 2018-12-10 VITALS — Ht 67.0 in | Wt 165.0 lb

## 2018-12-10 DIAGNOSIS — Z91018 Allergy to other foods: Secondary | ICD-10-CM | POA: Insufficient documentation

## 2018-12-10 NOTE — Progress Notes (Signed)
  Medical Nutrition Therapy:  Appt start time: 1600 end time:  1700.   Assessment:  Primary concerns today: Extreme and lengthy Food allergies.  Sees Dr. Pleas Koch at [place].  Long history of allergies. Black mold March-Oct 2019. Sees allergist in [place] on allergy injections. Had ulcers in mouth. List of foods can't tolerate: eggs, clams, hazelnuts . She has a long list. Limited to certain foods she can tolerate.  She takes some antibiotics and steroids at times for flare ups.  Preferred Learning Style:     No preference indicated   Learning Readiness:    Ready  Change in progress   MEDICATIONS:    DIETARY INTAKE:   24-hr recall:   Eats 2-3 times per day   Usual physical activity: ADL  Estimated energy needs: 1600  calories 180 g carbohydrates 120 g protein 44 g fat  Progress Towards Goal(s):  In progress.   Nutritional Diagnosis:  NB-1.1 Food and nutrition-related knowledge deficit As related to Food allergies.  As evidenced by food allergy testing.    Intervention:  Food allergies, elimination diet, reading food labels, food journal.  Goals Follow Food restrictions as discussed. Eat three balanced meals per day Keep a food journal and document GI side effects on sheet  Drink only water Read food labels Follow Gluten Free Diet  Teaching Method Utilized:  Visual Auditory Hands on  Handouts given during visit include:  Food allergy handout  My Plate  Barriers to learning/adherence to lifestyle change: none  Demonstrated degree of understanding via:  Teach Back   Monitoring/Evaluation:  Dietary intake, exercise,  and body weight in 1 month(s).

## 2018-12-16 ENCOUNTER — Encounter: Payer: Self-pay | Admitting: Nutrition

## 2018-12-18 ENCOUNTER — Encounter: Payer: Self-pay | Admitting: Nutrition

## 2018-12-18 NOTE — Patient Instructions (Signed)
Goals Follow Food restrictions as discussed. Eat three balanced meals per day Keep a food journal and document GI side effects on sheet  Drink only water Read food labels Follow Gluten Free Diet

## 2019-02-26 ENCOUNTER — Other Ambulatory Visit: Payer: Self-pay | Admitting: Family Medicine

## 2019-02-26 DIAGNOSIS — R921 Mammographic calcification found on diagnostic imaging of breast: Secondary | ICD-10-CM

## 2019-03-11 ENCOUNTER — Ambulatory Visit: Payer: BLUE CROSS/BLUE SHIELD | Admitting: Nutrition

## 2019-03-14 ENCOUNTER — Ambulatory Visit
Admission: RE | Admit: 2019-03-14 | Discharge: 2019-03-14 | Disposition: A | Discharge status: Home / Self Care | Payer: Managed Care, Other (non HMO) | Source: Ambulatory Visit | Attending: Family Medicine | Admitting: Family Medicine

## 2019-03-14 ENCOUNTER — Other Ambulatory Visit: Payer: Self-pay

## 2019-03-14 DIAGNOSIS — R921 Mammographic calcification found on diagnostic imaging of breast: Secondary | ICD-10-CM

## 2019-04-01 ENCOUNTER — Other Ambulatory Visit: Payer: Self-pay | Admitting: Surgery

## 2019-04-01 DIAGNOSIS — N6091 Unspecified benign mammary dysplasia of right breast: Secondary | ICD-10-CM

## 2019-04-08 ENCOUNTER — Other Ambulatory Visit: Payer: Self-pay | Admitting: Surgery

## 2019-04-08 DIAGNOSIS — N6091 Unspecified benign mammary dysplasia of right breast: Secondary | ICD-10-CM

## 2019-04-18 ENCOUNTER — Other Ambulatory Visit: Payer: Self-pay

## 2019-04-18 ENCOUNTER — Encounter (HOSPITAL_BASED_OUTPATIENT_CLINIC_OR_DEPARTMENT_OTHER): Payer: Self-pay | Admitting: Surgery

## 2019-04-21 ENCOUNTER — Other Ambulatory Visit (HOSPITAL_COMMUNITY)
Admission: RE | Admit: 2019-04-21 | Discharge: 2019-04-21 | Disposition: A | Discharge status: Home / Self Care | Payer: No Typology Code available for payment source | Source: Ambulatory Visit | Attending: Surgery | Admitting: Surgery

## 2019-04-21 DIAGNOSIS — Z20822 Contact with and (suspected) exposure to covid-19: Secondary | ICD-10-CM | POA: Diagnosis not present

## 2019-04-21 DIAGNOSIS — Z01812 Encounter for preprocedural laboratory examination: Secondary | ICD-10-CM | POA: Insufficient documentation

## 2019-04-21 LAB — SARS CORONAVIRUS 2 (TAT 6-24 HRS): SARS Coronavirus 2: NEGATIVE

## 2019-04-21 NOTE — Progress Notes (Signed)
Surgical soap given with instructions, pt verbalized understanding.  

## 2019-04-23 ENCOUNTER — Ambulatory Visit
Admission: RE | Admit: 2019-04-23 | Discharge: 2019-04-23 | Disposition: A | Discharge status: Home / Self Care | Payer: Managed Care, Other (non HMO) | Source: Ambulatory Visit | Attending: Surgery | Admitting: Surgery

## 2019-04-23 ENCOUNTER — Other Ambulatory Visit: Payer: Self-pay

## 2019-04-23 DIAGNOSIS — N6091 Unspecified benign mammary dysplasia of right breast: Secondary | ICD-10-CM

## 2019-04-23 NOTE — H&P (Signed)
Amber Stone Documented: 04/01/2019 1:45 PM Location: [facility] Patient #: [id number] DOB: [birth year] Married / Language: English / Race: White Female   History of Present Illness ([name] A. Ninfa Linden MD; 04/01/2019 2:05 PM) The patient is a 59 year old female who presents with a complaint of Breast problems. This patient is referred by the breast center for evaluation of abnormal calcifications in the right breast. She has undergone her screening mammography when she is found to have abnormal calcifications in the upper outer quadrant of the right breast. She underwent biopsy of the most anterior and posterior extent of the calcifications. Both areas showed fibrocystic changes as well as atypical ductal hyperplasia. Complete surgical excision of this area in the upper outer quadrant of the right breast has been recommended. She has a family history of breast cancer and multiple aunts and a grandmother on her paternal side. She's had previous resection of a benign mass in the upper-outer quadrant of the right breast 10 years ago. She denies nipple discharge. She is otherwise without complaints.   Past Surgical History Amber Stone, [place]; 04/01/2019 1:45 PM) Breast Biopsy  Bilateral. Gallbladder Surgery - Laparoscopic  Tonsillectomy   Diagnostic Studies History Amber Stone, [name]; 04/01/2019 1:45 PM) Colonoscopy  1-5 years ago Mammogram  within last year Pap Smear  1-5 years ago  Allergies Amber Stone, [name]; 04/01/2019 1:46 PM) No Known Drug Allergies [04/01/2019]: Allergies Reconciled   Medication History Amber Stone, [name]; 04/01/2019 1:47 PM) Omeprazole (40MG  Capsule DR, Oral) Active. Montelukast Sodium (10MG  Tablet, Oral) Active. Famotidine (40MG  Tablet, Oral) Active. hydrOXYzine HCl (25MG  Tablet, Oral) Active. Clemastine Fumarate (2.68MG  Tablet, Oral) Active. Symbicort (160-4.5MCG/ACT Aerosol, Inhalation) Active. Albuterol Sulfate HFA (108  (90 Base)MCG/ACT Aerosol Soln, Inhalation) Active. Spiriva Respimat (1.25MCG/ACT Aerosol Soln, Inhalation) Active. Ipratropium-Albuterol (0.5-2.5 (3)MG/3ML Solution, Inhalation) Active. Xiidra (5% Solution, Ophthalmic) Active. Medications Reconciled  Social History Amber Stone, [place]; 04/01/2019 1:45 PM) Alcohol use  Remotely quit alcohol use. Caffeine use  Coffee. No drug use  Tobacco use  Never smoker.  Family History Amber Stone, [place]; 04/01/2019 1:45 PM) Alcohol Abuse  Brother, Father. Cerebrovascular Accident  Mother. Heart Disease  Father, Mother. Heart disease in female family member before age 74  Heart disease in female family member before age 54  Hypertension  Father, Mother.  Pregnancy / Birth History Amber Stone, [place]; 04/01/2019 1:45 PM) Age at menarche  65 years. Age of menopause  44-55 Gravida  0 Para  0  Other Problems Amber Stone, [place]; 04/01/2019 1:45 PM) Asthma  Lump In Breast  Other disease, cancer, significant illness     Review of Systems Amber Stone [name]; 04/01/2019 1:45 PM) General Not Present- Appetite Loss, Chills, Fatigue, Fever, Night Sweats, Weight Gain and Weight Loss. Skin Not Present- Change in Wart/Mole, Dryness, Hives, Jaundice, New Lesions, Non-Healing Wounds, Rash and Ulcer. HEENT Present- Seasonal Allergies and Sinus Pain. Not Present- Earache, Hearing Loss, Hoarseness, Nose Bleed, Oral Ulcers, Ringing in the Ears, Sore Throat, Visual Disturbances, Wears glasses/contact lenses and Yellow Eyes. Respiratory Present- Wheezing. Not Present- Bloody sputum, Chronic Cough, Difficulty Breathing and Snoring. Breast Not Present- Breast Mass, Breast Pain, Nipple Discharge and Skin Changes. Cardiovascular Present- Swelling of Extremities. Not Present- Chest Pain, Difficulty Breathing Lying Down, Leg Cramps, Palpitations, Rapid Heart Rate and Shortness of Breath. Gastrointestinal Not Present- Abdominal Pain, Bloating, Bloody  Stool, Change in Bowel Habits, Chronic diarrhea, Constipation, Difficulty Swallowing, Excessive gas, Gets full quickly at meals, Hemorrhoids, Indigestion, Nausea, Rectal Pain and Vomiting. Female Genitourinary  Not Present- Frequency, Nocturia, Painful Urination, Pelvic Pain and Urgency. Musculoskeletal Present- Joint Stiffness. Not Present- Back Pain, Joint Pain, Muscle Pain, Muscle Weakness and Swelling of Extremities. Neurological Not Present- Decreased Memory, Fainting, Headaches, Numbness, Seizures, Tingling, Tremor, Trouble walking and Weakness. Psychiatric Not Present- Anxiety, Bipolar, Change in Sleep Pattern, Depression, Fearful and Frequent crying. Endocrine Present- Cold Intolerance and Heat Intolerance. Not Present- Excessive Hunger, Hair Changes, Hot flashes and New Diabetes. Hematology Present- Easy Bruising. Not Present- Blood Thinners, Excessive bleeding, Gland problems, HIV and Persistent Infections.  Vitals Amber Stone [name]; 04/01/2019 1:46 PM) 04/01/2019 1:45 PM Weight: 172.6 lb Height: 67in Body Surface Area: 1.9 m Body Mass Index: 27.03 kg/m  Temp.: 97.71F  Pulse: 82 (Regular)  BP: 126/78 (Sitting, Left Arm, Standard)       Physical Exam (Coleen Cardiff A. Ninfa Linden MD; 04/01/2019 2:06 PM) The physical exam findings are as follows: Note:She is with appearance  There is no cervical or supraclavicular adenopathy.  There is one slightly enlarged lymph node in the right axilla.  There are no palpable breast masses.  I have reviewed her mammograms as well as the pathology results and her notes in epic    Assessment & Plan (Chesney Klimaszewski A. Ninfa Linden MD; 04/01/2019 2:07 PM) ATYPICAL DUCTAL HYPERPLASIA OF RIGHT BREAST (N60.91) Impression: This is a patient with atypical ductal hyperplasia of the right breast. Again, the anterior and posterior extent of the calcifications and abnormality were marked with clips. I discussed the results of the biopsy with the patient and  gave her a copy of the pathology results. A radioactive seed guided right breast lumpectomy with 2 seeds is recommended to remove this area for complete histologic evaluation to rule out malignancy or DCIS. Again we discussed reasons for this with her in detail. I discussed surgical procedure in detail. I discussed the risks of the procedure. These risks include but are not limited to bleeding, infection, injury to surrounding structures, the need for further surgery malignancy is found, cardiopulmonary issues, postoperative recovery, etc. She understands and wishes to proceed with surgery which will be scheduled. This patient encounter took 30 minutes today to perform the following: take history, perform exam, review outside records, interpret imaging, counsel the patient on their diagnosis and document encounter, findings & plan in the EHR

## 2019-04-24 ENCOUNTER — Encounter (HOSPITAL_BASED_OUTPATIENT_CLINIC_OR_DEPARTMENT_OTHER): Payer: Self-pay | Admitting: Surgery

## 2019-04-24 ENCOUNTER — Ambulatory Visit (HOSPITAL_BASED_OUTPATIENT_CLINIC_OR_DEPARTMENT_OTHER): Payer: No Typology Code available for payment source | Admitting: Certified Registered"

## 2019-04-24 ENCOUNTER — Ambulatory Visit
Admission: RE | Admit: 2019-04-24 | Discharge: 2019-04-24 | Disposition: A | Discharge status: Home / Self Care | Payer: Managed Care, Other (non HMO) | Source: Ambulatory Visit | Attending: Surgery | Admitting: Surgery

## 2019-04-24 ENCOUNTER — Ambulatory Visit (HOSPITAL_BASED_OUTPATIENT_CLINIC_OR_DEPARTMENT_OTHER)
Admission: RE | Admit: 2019-04-24 | Discharge: 2019-04-24 | Disposition: A | Discharge status: Home / Self Care | Payer: No Typology Code available for payment source | Attending: Surgery | Admitting: Surgery

## 2019-04-24 ENCOUNTER — Encounter (HOSPITAL_BASED_OUTPATIENT_CLINIC_OR_DEPARTMENT_OTHER)
Admission: RE | Disposition: A | Discharge status: Home / Self Care | Payer: Self-pay | Source: Home / Self Care | Attending: Surgery

## 2019-04-24 ENCOUNTER — Other Ambulatory Visit: Payer: Self-pay

## 2019-04-24 DIAGNOSIS — J45909 Unspecified asthma, uncomplicated: Secondary | ICD-10-CM | POA: Insufficient documentation

## 2019-04-24 DIAGNOSIS — Z9049 Acquired absence of other specified parts of digestive tract: Secondary | ICD-10-CM | POA: Insufficient documentation

## 2019-04-24 DIAGNOSIS — N6091 Unspecified benign mammary dysplasia of right breast: Secondary | ICD-10-CM | POA: Diagnosis present

## 2019-04-24 DIAGNOSIS — K219 Gastro-esophageal reflux disease without esophagitis: Secondary | ICD-10-CM | POA: Diagnosis not present

## 2019-04-24 DIAGNOSIS — N6021 Fibroadenosis of right breast: Secondary | ICD-10-CM | POA: Insufficient documentation

## 2019-04-24 DIAGNOSIS — Z811 Family history of alcohol abuse and dependence: Secondary | ICD-10-CM | POA: Diagnosis not present

## 2019-04-24 DIAGNOSIS — Z803 Family history of malignant neoplasm of breast: Secondary | ICD-10-CM | POA: Diagnosis not present

## 2019-04-24 DIAGNOSIS — Z823 Family history of stroke: Secondary | ICD-10-CM | POA: Insufficient documentation

## 2019-04-24 DIAGNOSIS — M1990 Unspecified osteoarthritis, unspecified site: Secondary | ICD-10-CM | POA: Insufficient documentation

## 2019-04-24 DIAGNOSIS — Z79899 Other long term (current) drug therapy: Secondary | ICD-10-CM | POA: Diagnosis not present

## 2019-04-24 DIAGNOSIS — Z8249 Family history of ischemic heart disease and other diseases of the circulatory system: Secondary | ICD-10-CM | POA: Diagnosis not present

## 2019-04-24 DIAGNOSIS — G4730 Sleep apnea, unspecified: Secondary | ICD-10-CM | POA: Insufficient documentation

## 2019-04-24 HISTORY — DX: Allergy status to unspecified drugs, medicaments and biological substances: Z88.9

## 2019-04-24 HISTORY — DX: Unspecified osteoarthritis, unspecified site: M19.90

## 2019-04-24 HISTORY — DX: Unspecified asthma, uncomplicated: J45.909

## 2019-04-24 HISTORY — PX: BREAST LUMPECTOMY WITH RADIOACTIVE SEED LOCALIZATION: SHX6424

## 2019-04-24 HISTORY — DX: Other complications of anesthesia, initial encounter: T88.59XA

## 2019-04-24 SURGERY — BREAST LUMPECTOMY WITH RADIOACTIVE SEED LOCALIZATION
Anesthesia: General | Site: Breast | Laterality: Right

## 2019-04-24 MED ORDER — CHLORHEXIDINE GLUCONATE CLOTH 2 % EX PADS: 6.0000 | MEDICATED_PAD | Freq: Once | CUTANEOUS | Status: DC

## 2019-04-24 MED ORDER — PROPOFOL 500 MG/50ML IV EMUL
INTRAVENOUS | Status: AC
  Filled 2019-04-24: qty 100

## 2019-04-24 MED ORDER — ACETAMINOPHEN 500 MG PO TABS
ORAL_TABLET | ORAL | Status: AC
  Filled 2019-04-24: qty 2

## 2019-04-24 MED ORDER — DEXAMETHASONE SODIUM PHOSPHATE 10 MG/ML IJ SOLN
INTRAMUSCULAR | Status: AC
  Filled 2019-04-24: qty 1

## 2019-04-24 MED ORDER — PROMETHAZINE HCL 25 MG/ML IJ SOLN: 6.2500 mg | INTRAMUSCULAR | Status: DC | PRN

## 2019-04-24 MED ORDER — PROPOFOL 10 MG/ML IV BOLUS
INTRAVENOUS | Status: DC | PRN
  Administered 2019-04-24: 150 mg via INTRAVENOUS

## 2019-04-24 MED ORDER — LACTATED RINGERS IV SOLN: INTRAVENOUS | Status: DC

## 2019-04-24 MED ORDER — PROPOFOL 500 MG/50ML IV EMUL
INTRAVENOUS | Status: DC | PRN
  Administered 2019-04-24: 125 ug/kg/min via INTRAVENOUS

## 2019-04-24 MED ORDER — ACETAMINOPHEN 500 MG PO TABS
1000.0000 mg | ORAL_TABLET | ORAL | Status: AC
  Administered 2019-04-24: 09:00:00 1000 mg via ORAL

## 2019-04-24 MED ORDER — PROPOFOL 10 MG/ML IV BOLUS
INTRAVENOUS | Status: AC
  Filled 2019-04-24: qty 20

## 2019-04-24 MED ORDER — CEFAZOLIN SODIUM-DEXTROSE 2-4 GM/100ML-% IV SOLN
2.0000 g | INTRAVENOUS | Status: AC
  Administered 2019-04-24: 10:00:00 2 g via INTRAVENOUS

## 2019-04-24 MED ORDER — 0.9 % SODIUM CHLORIDE (POUR BTL) OPTIME
TOPICAL | Status: DC | PRN
  Administered 2019-04-24: 1000 mL

## 2019-04-24 MED ORDER — LIDOCAINE HCL (CARDIAC) PF 100 MG/5ML IV SOSY
PREFILLED_SYRINGE | INTRAVENOUS | Status: DC | PRN
  Administered 2019-04-24: 60 mg via INTRAVENOUS

## 2019-04-24 MED ORDER — MIDAZOLAM HCL 2 MG/2ML IJ SOLN: 1.0000 mg | INTRAMUSCULAR | Status: DC | PRN

## 2019-04-24 MED ORDER — ONDANSETRON HCL 4 MG/2ML IJ SOLN
INTRAMUSCULAR | Status: AC
  Filled 2019-04-24: qty 6

## 2019-04-24 MED ORDER — BUPIVACAINE HCL (PF) 0.5 % IJ SOLN
INTRAMUSCULAR | Status: DC | PRN
  Administered 2019-04-24: 15 mL

## 2019-04-24 MED ORDER — OXYCODONE HCL 5 MG/5ML PO SOLN: 5.0000 mg | Freq: Once | ORAL | Status: AC | PRN

## 2019-04-24 MED ORDER — OXYCODONE HCL 5 MG PO TABS
ORAL_TABLET | ORAL | Status: AC
  Filled 2019-04-24: qty 1

## 2019-04-24 MED ORDER — ACETAMINOPHEN 325 MG PO TABS: 325.0000 mg | ORAL_TABLET | Freq: Once | ORAL | Status: DC | PRN

## 2019-04-24 MED ORDER — TRAMADOL HCL 50 MG PO TABS: 50.0000 mg | ORAL_TABLET | Freq: Four times a day (QID) | ORAL | 0 refills | Status: DC | PRN

## 2019-04-24 MED ORDER — CEFAZOLIN SODIUM-DEXTROSE 2-4 GM/100ML-% IV SOLN
INTRAVENOUS | Status: AC
  Filled 2019-04-24: qty 100

## 2019-04-24 MED ORDER — FENTANYL CITRATE (PF) 100 MCG/2ML IJ SOLN
INTRAMUSCULAR | Status: AC
  Filled 2019-04-24: qty 2

## 2019-04-24 MED ORDER — CELECOXIB 200 MG PO CAPS
ORAL_CAPSULE | ORAL | Status: AC
  Filled 2019-04-24: qty 2

## 2019-04-24 MED ORDER — DEXAMETHASONE SODIUM PHOSPHATE 4 MG/ML IJ SOLN
INTRAMUSCULAR | Status: DC | PRN
  Administered 2019-04-24: 4 mg via INTRAVENOUS

## 2019-04-24 MED ORDER — GABAPENTIN 300 MG PO CAPS
ORAL_CAPSULE | ORAL | Status: AC
  Filled 2019-04-24: qty 1

## 2019-04-24 MED ORDER — MIDAZOLAM HCL 2 MG/2ML IJ SOLN
INTRAMUSCULAR | Status: AC
  Filled 2019-04-24: qty 2

## 2019-04-24 MED ORDER — ACETAMINOPHEN 10 MG/ML IV SOLN: 1000.0000 mg | Freq: Once | INTRAVENOUS | Status: DC | PRN

## 2019-04-24 MED ORDER — FENTANYL CITRATE (PF) 100 MCG/2ML IJ SOLN: 25.0000 ug | INTRAMUSCULAR | Status: DC | PRN

## 2019-04-24 MED ORDER — FENTANYL CITRATE (PF) 100 MCG/2ML IJ SOLN
INTRAMUSCULAR | Status: DC | PRN
  Administered 2019-04-24 (×3): 50 ug via INTRAVENOUS

## 2019-04-24 MED ORDER — MIDAZOLAM HCL 5 MG/5ML IJ SOLN
INTRAMUSCULAR | Status: DC | PRN
  Administered 2019-04-24: 2 mg via INTRAVENOUS

## 2019-04-24 MED ORDER — LIDOCAINE 2% (20 MG/ML) 5 ML SYRINGE
INTRAMUSCULAR | Status: AC
  Filled 2019-04-24: qty 5

## 2019-04-24 MED ORDER — EPHEDRINE 5 MG/ML INJ
INTRAVENOUS | Status: AC
  Filled 2019-04-24: qty 20

## 2019-04-24 MED ORDER — PHENYLEPHRINE 40 MCG/ML (10ML) SYRINGE FOR IV PUSH (FOR BLOOD PRESSURE SUPPORT)
PREFILLED_SYRINGE | INTRAVENOUS | Status: AC
  Filled 2019-04-24: qty 30

## 2019-04-24 MED ORDER — ONDANSETRON HCL 4 MG/2ML IJ SOLN
INTRAMUSCULAR | Status: DC | PRN
  Administered 2019-04-24: 4 mg via INTRAVENOUS

## 2019-04-24 MED ORDER — GABAPENTIN 300 MG PO CAPS
300.0000 mg | ORAL_CAPSULE | ORAL | Status: AC
  Administered 2019-04-24: 09:00:00 300 mg via ORAL

## 2019-04-24 MED ORDER — ACETAMINOPHEN 160 MG/5ML PO SOLN: 325.0000 mg | Freq: Once | ORAL | Status: DC | PRN

## 2019-04-24 MED ORDER — OXYCODONE HCL 5 MG PO TABS
5.0000 mg | ORAL_TABLET | Freq: Once | ORAL | Status: AC | PRN
  Administered 2019-04-24: 12:00:00 5 mg via ORAL

## 2019-04-24 MED ORDER — CELECOXIB 400 MG PO CAPS
400.0000 mg | ORAL_CAPSULE | ORAL | Status: AC
  Administered 2019-04-24: 400 mg via ORAL

## 2019-04-24 MED ORDER — FENTANYL CITRATE (PF) 100 MCG/2ML IJ SOLN: 50.0000 ug | INTRAMUSCULAR | Status: DC | PRN

## 2019-04-24 SURGICAL SUPPLY — 45 items
APPLIER CLIP 9.375 MED OPEN (MISCELLANEOUS)
BINDER BREAST 3XL (GAUZE/BANDAGES/DRESSINGS) IMPLANT
BINDER BREAST LRG (GAUZE/BANDAGES/DRESSINGS) ×2 IMPLANT
BINDER BREAST MEDIUM (GAUZE/BANDAGES/DRESSINGS) IMPLANT
BINDER BREAST XLRG (GAUZE/BANDAGES/DRESSINGS) IMPLANT
BINDER BREAST XXLRG (GAUZE/BANDAGES/DRESSINGS) IMPLANT
BLADE SURG 15 STRL LF DISP TIS (BLADE) ×1 IMPLANT
BLADE SURG 15 STRL SS (BLADE) ×1
CANISTER SUC SOCK COL 7IN (MISCELLANEOUS) IMPLANT
CANISTER SUCT 1200ML W/VALVE (MISCELLANEOUS) IMPLANT
CHLORAPREP W/TINT 26 (MISCELLANEOUS) ×2 IMPLANT
CLIP APPLIE 9.375 MED OPEN (MISCELLANEOUS) IMPLANT
COVER BACK TABLE 60X90IN (DRAPES) ×2 IMPLANT
COVER MAYO STAND STRL (DRAPES) ×2 IMPLANT
COVER PROBE W GEL 5X96 (DRAPES) ×2 IMPLANT
COVER WAND RF STERILE (DRAPES) IMPLANT
DECANTER SPIKE VIAL GLASS SM (MISCELLANEOUS) IMPLANT
DERMABOND ADVANCED (GAUZE/BANDAGES/DRESSINGS) ×1
DERMABOND ADVANCED .7 DNX12 (GAUZE/BANDAGES/DRESSINGS) ×1 IMPLANT
DRAPE LAPAROSCOPIC ABDOMINAL (DRAPES) ×2 IMPLANT
DRAPE UTILITY XL STRL (DRAPES) ×2 IMPLANT
ELECT REM PT RETURN 9FT ADLT (ELECTROSURGICAL) ×2
ELECTRODE REM PT RTRN 9FT ADLT (ELECTROSURGICAL) ×1 IMPLANT
GAUZE SPONGE 4X4 12PLY STRL LF (GAUZE/BANDAGES/DRESSINGS) IMPLANT
GLOVE SURG SIGNA 7.5 PF LTX (GLOVE) ×2 IMPLANT
GOWN STRL REUS W/ TWL LRG LVL3 (GOWN DISPOSABLE) ×1 IMPLANT
GOWN STRL REUS W/ TWL XL LVL3 (GOWN DISPOSABLE) ×1 IMPLANT
GOWN STRL REUS W/TWL LRG LVL3 (GOWN DISPOSABLE) ×1
GOWN STRL REUS W/TWL XL LVL3 (GOWN DISPOSABLE) ×1
KIT MARKER MARGIN INK (KITS) ×2 IMPLANT
NEEDLE HYPO 25X1 1.5 SAFETY (NEEDLE) ×2 IMPLANT
NS IRRIG 1000ML POUR BTL (IV SOLUTION) IMPLANT
PACK BASIN DAY SURGERY FS (CUSTOM PROCEDURE TRAY) ×2 IMPLANT
PENCIL SMOKE EVACUATOR (MISCELLANEOUS) ×2 IMPLANT
SLEEVE SCD COMPRESS KNEE MED (MISCELLANEOUS) ×2 IMPLANT
SPONGE LAP 4X18 RFD (DISPOSABLE) ×2 IMPLANT
SUT MNCRL AB 4-0 PS2 18 (SUTURE) ×2 IMPLANT
SUT SILK 2 0 SH (SUTURE) IMPLANT
SUT VIC AB 3-0 SH 27 (SUTURE) ×1
SUT VIC AB 3-0 SH 27X BRD (SUTURE) ×1 IMPLANT
SYR CONTROL 10ML LL (SYRINGE) ×2 IMPLANT
TOWEL GREEN STERILE FF (TOWEL DISPOSABLE) ×2 IMPLANT
TRAY FAXITRON CT DISP (TRAY / TRAY PROCEDURE) ×2 IMPLANT
TUBE CONNECTING 20X1/4 (TUBING) IMPLANT
YANKAUER SUCT BULB TIP NO VENT (SUCTIONS) IMPLANT

## 2019-04-24 NOTE — Discharge Instructions (Signed)
[place] Office Phone Number [phone]  BREAST BIOPSY/ PARTIAL MASTECTOMY: POST OP INSTRUCTIONS  Always review your discharge instruction sheet given to you by the facility where your surgery was performed.  IF YOU HAVE DISABILITY OR FAMILY LEAVE FORMS, YOU MUST BRING THEM TO THE OFFICE FOR PROCESSING.  DO NOT GIVE THEM TO YOUR DOCTOR.  1. A prescription for pain medication may be given to you upon discharge.  Take your pain medication as prescribed, if needed.  If narcotic pain medicine is not needed, then you may take acetaminophen (Tylenol) or ibuprofen (Advil) as needed. 2. Take your usually prescribed medications unless otherwise directed 3. If you need a refill on your pain medication, please contact your pharmacy.  They will contact our office to request authorization.  Prescriptions will not be filled after 5pm or on week-ends. 4. You should eat very light the first 24 hours after surgery, such as soup, crackers, pudding, etc.  Resume your normal diet the day after surgery. 5. Most patients will experience some swelling and bruising in the breast.  Ice packs and a good support bra will help.  Swelling and bruising can take several days to resolve.  6. It is common to experience some constipation if taking pain medication after surgery.  Increasing fluid intake and taking a stool softener will usually help or prevent this problem from occurring.  A mild laxative (Milk of Magnesia or Miralax) should be taken according to package directions if there are no bowel movements after 48 hours. 7. Unless discharge instructions indicate otherwise, you may remove your bandages 24-48 hours after surgery, and you may shower at that time.  You may have steri-strips (small skin tapes) in place directly over the incision.  These strips should be left on the skin for 7-10 days.  If your surgeon used skin glue on the incision, you may shower in 24 hours.  The glue will flake off over the  next 2-3 weeks.  Any sutures or staples will be removed at the office during your follow-up visit. 8. ACTIVITIES:  You may resume regular daily activities (gradually increasing) beginning the next day.  Wearing a good support bra or sports bra minimizes pain and swelling.  You may have sexual intercourse when it is comfortable. a. You may drive when you no longer are taking prescription pain medication, you can comfortably wear a seatbelt, and you can safely maneuver your car and apply brakes. b. RETURN TO WORK:  ______________________________________________________________________________________ 9. You should see your doctor in the office for a follow-up appointment approximately two weeks after your surgery.  Your doctor's nurse will typically make your follow-up appointment when she calls you with your pathology report.  Expect your pathology report 2-3 business days after your surgery.  You may call to check if you do not hear from [place] after three days. 10. OTHER INSTRUCTIONS:OK TO SHOWER STARTING TOMORROW 11. ICE PACK, TYLENOL, AND IBUPROFEN ALSO FOR PAIN 12. NO VIGOROUS ACTIVITY FOR ONE WEEK 13. OK TO REMOVE THE BINDER TOMORROW _______________________________________________________________________________________________ _____________________________________________________________________________________________________________________________________ _____________________________________________________________________________________________________________________________________ _____________________________________________________________________________________________________________________________________  WHEN TO CALL YOUR DOCTOR: 1. Fever over 101.0 2. Nausea and/or vomiting. 3. Extreme swelling or bruising. 4. Continued bleeding from incision. 5. Increased pain, redness, or drainage from the incision.  The clinic staff is available to answer your questions during regular  business hours.  Please don't hesitate to call and ask to speak to one of the nurses for clinical concerns.  If you have a medical emergency, go to the nearest  emergency room or call 911.  A surgeon from [facility] is always on call at the hospital.  For further questions, please visit [email or web address]    Post Anesthesia Home Care Instructions  Activity: Get plenty of rest for the remainder of the day. A responsible individual must stay with you for 24 hours following the procedure.  For the next 24 hours, DO NOT: -Drive a car -Paediatric nurse -Drink alcoholic beverages -Take any medication unless instructed by your physician -Make any legal decisions or sign important papers.  Meals: Start with liquid foods such as gelatin or soup. Progress to regular foods as tolerated. Avoid greasy, spicy, heavy foods. If nausea and/or vomiting occur, drink only clear liquids until the nausea and/or vomiting subsides. Call your physician if vomiting continues.  Special Instructions/Symptoms: Your throat may feel dry or sore from the anesthesia or the breathing tube placed in your throat during surgery. If this causes discomfort, gargle with warm salt water. The discomfort should disappear within 24 hours.  If you had a scopolamine patch placed behind your ear for the management of post- operative nausea and/or vomiting:  1. The medication in the patch is effective for 72 hours, after which it should be removed.  Wrap patch in a tissue and discard in the trash. Wash hands thoroughly with soap and water. 2. You may remove the patch earlier than 72 hours if you experience unpleasant side effects which may include dry mouth, dizziness or visual disturbances. 3. Avoid touching the patch. Wash your hands with soap and water after contact with the patch.     *May have Tylenol at 3pm *May have Ibuprofen at 5pm

## 2019-04-24 NOTE — Anesthesia Procedure Notes (Signed)
Procedure Name: LMA Insertion Date/Time: 04/24/2019 9:52 AM Performed by: Signe Colt, CRNA Pre-anesthesia Checklist: Patient identified, Emergency Drugs available, Suction available and Patient being monitored Patient Re-evaluated:Patient Re-evaluated prior to induction Oxygen Delivery Method: Circle system utilized Preoxygenation: Pre-oxygenation with 100% oxygen Induction Type: IV induction Ventilation: Mask ventilation without difficulty LMA: LMA inserted LMA Size: 4.0 Number of attempts: 1 Airway Equipment and Method: Bite block Placement Confirmation: positive ETCO2 Tube secured with: Tape Dental Injury: Teeth and Oropharynx as per pre-operative assessment

## 2019-04-24 NOTE — Interval H&P Note (Signed)
History and Physical Interval Note: no change in H and P  04/24/2019 9:16 AM  Amber Stone  has presented today for surgery, with the diagnosis of RIGHT BREAST ATYPICAL DUCTAL HYPERPLASIA.  The various methods of treatment have been discussed with the patient and family. After consideration of risks, benefits and other options for treatment, the patient has consented to  Procedure(s): RIGHT BREAST LUMPECTOMY WITH RADIOACTIVE SEED LOCALIZATION (Right) as a surgical intervention.  The patient's history has been reviewed, patient examined, no change in status, stable for surgery.  I have reviewed the patient's chart and labs.  Questions were answered to the patient's satisfaction.     Coralie Keens

## 2019-04-24 NOTE — Anesthesia Preprocedure Evaluation (Addendum)
Anesthesia Evaluation  Patient identified by MRN, date of birth, ID band Patient awake    Reviewed: Allergy & Precautions, NPO status , Patient's Chart, lab work & pertinent test results  Airway Mallampati: I  TM Distance: >3 FB Neck ROM: Full    Dental  (+) Teeth Intact, Dental Advisory Given   Pulmonary asthma , sleep apnea ,    breath sounds clear to auscultation       Cardiovascular negative cardio ROS   Rhythm:Regular Rate:Normal     Neuro/Psych negative neurological ROS  negative psych ROS   GI/Hepatic Neg liver ROS, GERD  Medicated,  Endo/Other  negative endocrine ROS  Renal/GU negative Renal ROS     Musculoskeletal  (+) Arthritis ,   Abdominal Normal abdominal exam  (+)   Peds  Hematology negative hematology ROS (+)   Anesthesia Other Findings   Reproductive/Obstetrics                            Lab Results  Component Value Date   WBC 9.3 03/16/2014   HGB 13.9 03/16/2014   HCT 40.9 03/16/2014   MCV 96.1 03/16/2014   PLT 411.0 (H) 03/16/2014   Lab Results  Component Value Date   CREATININE 1.00 03/16/2014   BUN 19 03/16/2014   NA 137 03/16/2014   K 3.8 03/16/2014   CL 103 03/16/2014   CO2 26 03/16/2014     Anesthesia Physical Anesthesia Plan  ASA: II  Anesthesia Plan: General   Post-op Pain Management:    Induction: Intravenous  PONV Risk Score and Plan: 4 or greater and Ondansetron, Dexamethasone, Treatment may vary due to age or medical condition and TIVA  Airway Management Planned: LMA  Additional Equipment: None  Intra-op Plan:   Post-operative Plan: Extubation in OR  Informed Consent: I have reviewed the patients History and Physical, chart, labs and discussed the procedure including the risks, benefits and alternatives for the proposed anesthesia with the patient or authorized representative who has indicated his/her understanding and acceptance.      Dental advisory given  Plan Discussed with: CRNA  Anesthesia Plan Comments:        Anesthesia Quick Evaluation

## 2019-04-24 NOTE — Op Note (Signed)
RIGHT BREAST LUMPECTOMY WITH RADIOACTIVE SEED X 2  Procedure Note  Amber Stone 04/24/2019   Pre-op Diagnosis: RIGHT BREAST ATYPICAL DUCTAL HYPERPLASIA     Post-op Diagnosis: same  Procedure(s): RIGHT BREAST LUMPECTOMY WITH RADIOACTIVE SEED X 2  Surgeon(s): Coralie Keens, MD  Anesthesia: General  Staff:  Circulator: Paulette Blanch, RN Scrub Person: Rascoe, Marius Ditch, CST  Estimated Blood Loss: Minimal               Specimens: sent to path  Indications: This is a 59 year old female with abnormal calcifications in the right breast.  She had the most anterior and posterior aspects biopsied under stereotactic guidance both showing atypical ductal hyperplasia.  The decision was made to proceed with a radioactive seed bracketed lumpectomy of the right breast remove these areas.  Findings: On the final lumpectomy specimen, both seeds were identified as well as one of the biopsy clips.  Procedure: Patient brought to operating identifies correct patient.  She is placed upon the operating table general anesthesia induced.  Her right breast was then prepped and draped in usual sterile fashion.  I anesthetized skin on the lateral edge of the areola with Marcaine.  I then made a circumareolar incision with a scalpel.  With the aid of the neoprobe I then dissected toward the radioactive seeds.  1 appeared at approximate 12 o'clock position and was more superficial and the other was at approximately L 11 o'clock position and was deeper in the breast tissue.  With the aid of neoprobe I did a wide excision of this area going down to the chest wall.  Once the specimen was completely removed I marked all margins marker pain.  An x-ray was performed on the specimen.  Both radioactive seeds were present as well as multiple calcifications in one of the clips from the previous biopsy.  At this point I elected to not take any further tissue as both seeds were removed.  Specimen sent to pathology for  evaluation.  Hemostasis was achieved with cautery.  I anesthetized incision for the Marcaine.  I then closed the subcutaneous tissue with interrupted 3-0 Vicryl sutures and closed skin with a running 4-0 Monocryl.  Dermabond and a binder were applied.  The patient tolerated procedure well.  All the counts were correct at the end of the procedure.  The patient was then extubated in the operating and taken in a stable condition to the recovery room.          Coralie Keens   Date: 04/24/2019  Time: 10:43 AM

## 2019-04-24 NOTE — Anesthesia Postprocedure Evaluation (Signed)
Anesthesia Post Note  Patient: Amber Stone  Procedure(s) Performed: RIGHT BREAST LUMPECTOMY WITH RADIOACTIVE SEED X 2 (Right Breast)     Patient location during evaluation: PACU Anesthesia Type: General Level of consciousness: awake and alert Pain management: pain level controlled Vital Signs Assessment: post-procedure vital signs reviewed and stable Respiratory status: spontaneous breathing, nonlabored ventilation, respiratory function stable and patient connected to nasal cannula oxygen Cardiovascular status: blood pressure returned to baseline and stable Postop Assessment: no apparent nausea or vomiting Anesthetic complications: no    Last Vitals:  Vitals:   04/24/19 1100 04/24/19 1115  BP: 116/78 (!) 107/92  Pulse: 77 64  Resp: 18 17  Temp:  36.6 C  SpO2: 97% 97%    Last Pain:  Vitals:   04/24/19 1115  TempSrc:   PainSc: 2                  Effie Berkshire

## 2019-04-24 NOTE — Transfer of Care (Signed)
Immediate Anesthesia Transfer of Care Note  Patient: Amber Stone  Procedure(s) Performed: RIGHT BREAST LUMPECTOMY WITH RADIOACTIVE SEED X 2 (Right Breast)  Patient Location: PACU  Anesthesia Type:General  Level of Consciousness: awake, alert , oriented and patient cooperative  Airway & Oxygen Therapy: Patient Spontanous Breathing and Patient connected to face mask oxygen  Post-op Assessment: Report given to RN and Post -op Vital signs reviewed and stable  Post vital signs: Reviewed and stable  Last Vitals:  Vitals Value Taken Time  BP 108/60 04/24/19 1043  Temp    Pulse 78 04/24/19 1043  Resp 21 04/24/19 1043  SpO2 100 % 04/24/19 1043  Vitals shown include unvalidated device data.  Last Pain:  Vitals:   04/24/19 0846  TempSrc: Tympanic  PainSc: 0-No pain      Patients Stated Pain Goal: 6 ([id number])  Complications: No apparent anesthesia complications

## 2019-04-25 ENCOUNTER — Encounter: Payer: Self-pay | Admitting: *Deleted

## 2019-04-28 LAB — SURGICAL PATHOLOGY

## 2019-05-22 HISTORY — PX: BREAST BIOPSY: SHX20

## 2019-12-16 ENCOUNTER — Encounter (HOSPITAL_COMMUNITY): Payer: Self-pay

## 2020-02-10 ENCOUNTER — Telehealth: Payer: Self-pay

## 2020-02-10 ENCOUNTER — Encounter: Payer: Self-pay | Admitting: Family Medicine

## 2020-02-10 ENCOUNTER — Other Ambulatory Visit: Payer: Self-pay

## 2020-02-10 ENCOUNTER — Ambulatory Visit (INDEPENDENT_AMBULATORY_CARE_PROVIDER_SITE_OTHER): Payer: No Typology Code available for payment source | Admitting: Family Medicine

## 2020-02-10 VITALS — BP 126/70 | HR 71 | Temp 98.9°F | Ht 67.5 in | Wt 181.0 lb

## 2020-02-10 DIAGNOSIS — K219 Gastro-esophageal reflux disease without esophagitis: Secondary | ICD-10-CM | POA: Diagnosis not present

## 2020-02-10 DIAGNOSIS — M720 Palmar fascial fibromatosis [Dupuytren]: Secondary | ICD-10-CM | POA: Diagnosis not present

## 2020-02-10 DIAGNOSIS — J4520 Mild intermittent asthma, uncomplicated: Secondary | ICD-10-CM

## 2020-02-10 DIAGNOSIS — G4733 Obstructive sleep apnea (adult) (pediatric): Secondary | ICD-10-CM | POA: Diagnosis not present

## 2020-02-10 DIAGNOSIS — G250 Essential tremor: Secondary | ICD-10-CM | POA: Insufficient documentation

## 2020-02-10 NOTE — Patient Instructions (Addendum)
  I appreciate the opportunity to provide you with care for your health and wellness. Today we discussed: established care   Follow up: July/Aug for CPE- fasting appt with pap - please allow an hour   No labs or referrals today  Nice to meet you today!  Merry Christmas and Happy New Year!  Please continue to practice social distancing to keep you, your family, and our community safe.  If you must go out, please wear a mask and practice good handwashing.  It was a pleasure to see you and I look forward to continuing to work together on your health and well-being. Please do not hesitate to call the office if you need care or have questions about your care.  Have a wonderful day. With Gratitude, Cherly Beach, DNP, AGNP-BC

## 2020-02-10 NOTE — Assessment & Plan Note (Signed)
Reports weight loss that is helped tremendously and she does not use a CPAP.

## 2020-02-10 NOTE — Assessment & Plan Note (Signed)
Controlled, continue current use of omeprazole.

## 2020-02-10 NOTE — Telephone Encounter (Signed)
error 

## 2020-02-10 NOTE — Assessment & Plan Note (Signed)
Family history of tremor.  Questionable familial versus side effect of Prozac she reports is improved but is still present.  Currently does not want take any medication for it at this time she does not feel that it is necessary.

## 2020-02-10 NOTE — Progress Notes (Signed)
Subjective:  Patient ID: Amber Stone, female    DOB: 20-Sep-1960  Age: 59 y.o. MRN: [id number]  CC:  Chief Complaint  Patient presents with  . Anxiety    Is currently on Lexapro, wants to discuss stopping this.  . Hand Pain    Index finger on L has been bothering her x2 years. Index finger on R has a wart.      HPI  HPI   Amber Stone is a 59 year old female patient who presents today to establish care. Her husband is also a patient of this practice of mine as well.   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  Today she reports her biggest concern is that she wanted to know when she could wean off of her Lexapro.  She went on this in the spring 2021 secondary to having a lot of stress related to Covid, being at home her husband's health in addition to having a lumpectomy.  Overall she is doing much better she reports especially in the last several weeks.  Wanted to know if she can wean off of this at her previous provider advised for her to stay on at least for 6 months before weaning if not longer.  She reports that they started on Prozac first and this caused her to have excessive tremors which she does have a family history of having essential tremors in her mother and her grandmother.  However she reports even after coming off the Prozac she still has trouble.  Her other concern is that she needs to be seen for contracture of her left hand ring finger.  She is open to referral for this.  She reports has been going on for a while and is just getting worse to the point where her hand now likes to close.  She has never been seen by anybody for this previously.  Health maintenance wise she is due for a Pap smear is been several years.  She is up-to-date on her colonoscopy and mammograms.  She reports she sees an eye doctor  regularly.  Dentist as needed.  And she has an allergist that cares for her and she takes injections for this to help with some of the allergies she has.  She denies having trouble sleeping.  Denies having any changes in chewing or swallowing.  Denies having changes in bowel or bladder habits.  Reports some forgetfulness at times but she reports "I am scattered" she is always been like that for her.  She denies having any falls or injuries.  Denies any skin, hearing or vision changes.  She does report that she is not on a cholesterol medication even though has been recommended in the past.  Secondary to her just not wanting to be on medication.  But because of her diet and inability to eat a lot of plant-based food she has to eat a lot of beef products which cause her have an elevation in her cholesterol.  She denies having any chest pain, cough, shortness of breath, headaches, dizziness.  She does not do the Covid vaccines.  She no longer does flu vaccines.  Today patient denies signs and symptoms of COVID 19 infection including fever, chills, cough, shortness of breath, and headache. Past Medical, Surgical, Social History, Allergies, and Medications have been Reviewed.   Past Medical History:  Diagnosis Date  . Allergic rhinitis   . Allergy to chemicals   . Arthritis    hands  . Asthma   . Asthma due to environmental allergies   . Chest pain 03/16/2014  . Complication of anesthesia    hard to wake up  . Depression   . GERD (gastroesophageal reflux disease)   . OSA (obstructive sleep apnea)    lost weight, not used CPAP in over 5 yrs  . Soft tissue mass, left anterior costal margin 03/26/2013  . TONSILLECTOMY, HX OF 12/19/2006   Annotation: 1978 Qualifier: Diagnosis of  By: Milana Obey, Doroteo Bradford    . Vocal cord dysfunction 12/19/2006   Qualifier: Diagnosis of  By: Wynetta Emery RN, Doroteo Bradford      Current Meds  Medication Sig  . albuterol (VENTOLIN HFA) 108 (90 Base) MCG/ACT inhaler Inhale 2  puffs into the lungs every 4 (four) hours as needed.  . budesonide-formoterol (SYMBICORT) 160-4.5 MCG/ACT inhaler Inhale 2 puffs into the lungs 2 (two) times daily. (Patient taking differently: Inhale 2 puffs into the lungs 2 (two) times daily as needed (shortness of breath).)  . cetirizine (ZYRTEC) 10 MG tablet Take 10 mg by mouth daily.  . Cholecalciferol (VITAMIN D-3) 5000 UNITS TABS Take by mouth daily.   . clemastine (TAVIST) 2.68 MG TABS tablet Take 2.68 mg by mouth 2 (two) times daily.  . famotidine (PEPCID) 40 MG tablet Take 40 mg by mouth 2 (two) times daily.  . hydrOXYzine (ATARAX/VISTARIL) 25 MG tablet Take 25 mg by mouth 3 (three) times daily as needed for itching.  Marland Kitchen ipratropium-albuterol (DUONEB) 0.5-2.5 (3) MG/3ML SOLN Inhale 3 mLs into the lungs every 6 (six) hours as needed (shortness of breath wheezing).   . montelukast (SINGULAIR) 10 MG tablet Take 10 mg by mouth at bedtime.  . Multiple Vitamin (MULTIVITAMIN) capsule Take 1 capsule by mouth daily.  Marland Kitchen omeprazole (PRILOSEC) 20 MG capsule Take 40 mg by mouth daily.  Marland Kitchen tiotropium (SPIRIVA) 18 MCG inhalation capsule Place 18 mcg into inhaler and inhale daily.  . traMADol (ULTRAM) 50 MG tablet Take 1 tablet (50 mg total) by mouth every 6 (six) hours as needed for moderate pain.    ROS:  Review of Systems  HENT: Negative.   Eyes: Negative.   Respiratory: Negative.   Cardiovascular: Negative.   Gastrointestinal: Negative.   Genitourinary: Negative.   Musculoskeletal:       Hand contracture  Skin: Negative.   Neurological: Positive for tremors.  Endo/Heme/Allergies: Negative.   Psychiatric/Behavioral: Negative.      Objective:   Today's Vitals: BP 126/70 (BP Location: Right Arm, Patient Position: Sitting, Cuff Size: Normal)   Pulse 71   Temp 98.9 F (37.2 C) (Temporal)   Ht 5' 7.5" (1.715 m)   Wt 181 lb (82.1 kg)   SpO2 99%   BMI 27.93 kg/m  Vitals with BMI 02/10/2020 04/24/2019 04/24/2019  Height 5' 7.5" - -   Weight 181 lbs - -  BMI 84.16 - -  Systolic 606 301 601  Diastolic 70 57 92  Pulse 71 62 64     Physical Exam Vitals and nursing note reviewed.  Constitutional:      Appearance: Normal appearance. She is well-developed, well-groomed and overweight.  HENT:     Head: Normocephalic and atraumatic.     Comments: Mask in place    Right Ear: External ear normal.     Left Ear: External ear normal.     Nose: Nose normal.  Mouth/Throat:     Mouth: Mucous membranes are moist.     Pharynx: Oropharynx is clear.  Eyes:     General:        Right eye: No discharge.        Left eye: No discharge.     Conjunctiva/sclera: Conjunctivae normal.  Cardiovascular:     Rate and Rhythm: Normal rate and regular rhythm.     Pulses: Normal pulses.     Heart sounds: Normal heart sounds.  Pulmonary:     Effort: Pulmonary effort is normal.     Breath sounds: Normal breath sounds.  Musculoskeletal:        General: Normal range of motion.     Cervical back: Normal range of motion and neck supple.     Comments: Contracture of the left ring finger.  Skin:    General: Skin is warm.  Neurological:     General: No focal deficit present.     Mental Status: She is alert and oriented to person, place, and time.     Motor: Tremor present.  Psychiatric:        Attention and Perception: Attention normal.        Mood and Affect: Mood normal.        Speech: Speech normal.        Behavior: Behavior normal. Behavior is cooperative.        Thought Content: Thought content normal.        Cognition and Memory: Cognition normal.        Judgment: Judgment normal.     Assessment   1. Dupuytren's contracture of left hand   2. Mild intermittent asthma without complication   3. Gastroesophageal reflux disease without esophagitis   4. OSA (obstructive sleep apnea)   5. Benign essential tremor     Tests ordered Orders Placed This Encounter  Procedures  . Ambulatory referral to Orthopedic Surgery      Plan: Please see assessment and plan per problem list above.   No orders of the defined types were placed in this encounter.   Patient to follow-up in August for [place], NP

## 2020-02-10 NOTE — Assessment & Plan Note (Signed)
Referral to orthopedist

## 2020-02-10 NOTE — Assessment & Plan Note (Signed)
Is being followed by an allergist and receiving treatment overall doing well with this. Does have lots of flare secondary to uncontrolled allergies at times.  But getting steroid injections for this to help.  Advised to continue on treatments based off of allergist recommendations.

## 2020-03-08 ENCOUNTER — Ambulatory Visit: Payer: No Typology Code available for payment source | Admitting: Orthopedic Surgery

## 2020-03-08 ENCOUNTER — Ambulatory Visit: Payer: No Typology Code available for payment source

## 2020-03-08 ENCOUNTER — Encounter: Payer: Self-pay | Admitting: Orthopedic Surgery

## 2020-03-08 ENCOUNTER — Other Ambulatory Visit: Payer: Self-pay

## 2020-03-08 VITALS — BP 146/75 | HR 67 | Ht 67.5 in | Wt 176.0 lb

## 2020-03-08 DIAGNOSIS — M720 Palmar fascial fibromatosis [Dupuytren]: Secondary | ICD-10-CM | POA: Diagnosis not present

## 2020-03-08 DIAGNOSIS — M79645 Pain in left finger(s): Secondary | ICD-10-CM

## 2020-03-08 NOTE — Patient Instructions (Signed)
Dupuytren's Contracture Dupuytren's contracture is a condition in which tissue under the skin of the palm becomes thick. This causes one or more of the fingers to curl inward (contract) toward the palm. After a while, the fingers may not be able to straighten out. This condition affects some or all of the fingers and the palm of the hand. This condition may affect one or both hands. Dupuytren's contracture is a long-term (chronic) condition that develops (progresses) slowly over time. There is no cure, but symptoms can be managed and progression can be slowed with treatment. This condition is usually not dangerous or painful, but it can interfere with everyday tasks. What are the causes? This condition is caused by tissue (fascia) in the palm that gets thicker and tighter. When the fascia thickens, it pulls on the cords of tissue (tendons) that control finger movement. This causes the fingers to contract. The cause of fascia thickening is not known. However, the condition is often passed along from parent to child (inherited).   What increases the risk? The following factors may make you more likely to develop this condition:  Being 40 years of age or older.  Being female.  Having a family history of this condition.  Using tobacco products, including cigarettes, chewing tobacco, and e-cigarettes.  Drinking alcohol excessively.  Having diabetes.  Having a seizure disorder. What are the signs or symptoms? Early symptoms of this condition may include:  Thick, puckered skin on the hand.  One or more lumps (nodules) on the palm. Nodules may be tender when they first appear, but they are generally painless. Later symptoms of this condition may include:  Thick cords of tissue in the palm.  Fingers curled up toward the palm.  Inability to straighten the fingers into their normal position. Though this condition is usually painless, you may have discomfort when holding or grabbing objects.    How is this diagnosed? This condition is diagnosed with a physical exam, which may include:  Looking at your hands and feeling your palms. This is to check for thickened fascia and nodules.  Measuring finger motion.  Doing the [place] tabletop test. You may be asked to try to put your hand on a surface, with your palm down and your fingers straight out. How is this treated? There is no cure for this condition, but treatment can relieve discomfort and make symptoms more manageable. Treatment options may include:  Physical therapy. This can strengthen your hand and increase flexibility.  Occupational therapy. This can help you with everyday tasks that may be more difficult because of your condition.  Shots (injections). Substances may be injected into your hand, such as: ? Medicines that help to decrease swelling (corticosteroids). ? Proteins (collagenase) to weaken thick tissue. After a collagenase injection, your health care provider may stretch your fingers.  Needle aponeurotomy. A needle is pushed through the skin and into the fascia. Moving the needle against the fascia can weaken or break up the thick tissue.  Surgery. This may be needed if your condition causes discomfort or interferes with everyday activities. Physical therapy is usually needed after surgery. No treatment is guaranteed to cure this condition. Recurrence of symptoms is common. Follow these instructions at home: Hand care  Take these actions to help protect your hand from possible injury: ? Use tools that have padded grips. ? Wear protective gloves while you work with your hands. ? Avoid repetitive hand movements. General instructions  Take over-the-counter and prescription medicines only as told by   your health care provider.  Manage any other conditions that you have, such as diabetes.  If physical therapy was prescribed, do exercises as told by your health care provider.  Do not use any products that  contain nicotine or tobacco, such as cigarettes, e-cigarettes, and chewing tobacco. If you need help quitting, ask your health care provider.  If you drink alcohol: ? Limit how much you use to:  0-1 drink a day for women.  0-2 drinks a day for men. ? Be aware of how much alcohol is in your drink. In the [place] one drink equals one 12 oz bottle of beer (355 mL), one 5 oz glass of wine (148 mL), or one 1 oz glass of hard liquor (44 mL).  Keep all follow-up visits as told by your health care provider. This is important. Contact a health care provider if:  You develop new symptoms, or your symptoms get worse.  You have pain that gets worse or does not get better with medicine.  You have difficulty or discomfort with everyday tasks.  You develop numbness or tingling. Get help right away if:  You have severe pain.  Your fingers change color or become unusually cold. Summary  Dupuytren's contracture is a condition in which tissue under the skin of the palm becomes thick.  This condition is caused by tissue (fascia) that thickens. When it thickens, it pulls on the cords of tissue (tendons) that control finger movement and makes the fingers to contract.  You are more likely to develop this condition if you are a man, are over 40 years of age, have a family history of the condition, and drink a lot of alcohol.  This condition can be treated with physical and occupational therapy, injections, and surgery.  Follow instructions about how to care for your hand. Get help right away if you have severe pain or your fingers change color or become cold. This information is not intended to replace advice given to you by your health care provider. Make sure you discuss any questions you have with your health care provider. Document Revised: 09/04/2017 Document Reviewed: 09/04/2017 Elsevier Patient Education  2021 [employer] Inc.  

## 2020-03-08 NOTE — Progress Notes (Signed)
Amber Stone  03/08/2020  Body mass index is 27.16 kg/m.  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  . Dupuytren contracture Yes  . Pain in left finger(s)      HISTORY SECTION :  Chief Complaint  Patient presents with  . 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  She has contracture of the palm over the ring finger of the same hand  The crossover deformity is causing her to have trouble making a fist   Review of Systems  Constitutional: Negative for chills and fever.  Neurological: Negative for sensory change and focal weakness.     has a past medical history of Allergic rhinitis, Allergy to chemicals, Arthritis, Asthma, Asthma due to environmental allergies, Chest pain (4/62/7035), Complication of anesthesia, Depression, GERD (gastroesophageal reflux disease), OSA (obstructive sleep apnea), Soft tissue mass, left anterior costal margin (03/26/2013), TONSILLECTOMY, HX OF (12/19/2006), and Vocal cord dysfunction (12/19/2006).   Past Surgical History:  Procedure Laterality Date  . BREAST LUMPECTOMY WITH RADIOACTIVE SEED LOCALIZATION Right  04/24/2019   Procedure: RIGHT BREAST LUMPECTOMY WITH RADIOACTIVE SEED X 2;  Surgeon: Coralie Keens, MD;  Location: [facility];  Service: General;  Laterality: Right;  . BREAST SURGERY     bx right side  . CHOLECYSTECTOMY    . COLONOSCOPY N/A 12/30/2014   Procedure: COLONOSCOPY;  Surgeon: Rogene Houston, MD;  Location: [facility];  Service: Endoscopy;  Laterality: N/A;  730  . GANGLION CYST EXCISION     left hand  . LASIK  1997  . LIPOMA EXCISION     lower back  . NASAL SINUS SURGERY    . tonsillectomy  1978    Social History   Tobacco Use  . Smoking status: Never Smoker  . Smokeless tobacco: Never Used  Substance Use Topics  . Alcohol use: No  . Drug use: No    Family History  Problem Relation Age of Onset  . Coronary artery disease Mother   . Rheum arthritis Mother   . Coronary artery disease Father   . Heart disease Father   . Cancer Maternal Grandfather        leukemia  . Cancer Paternal Grandmother        breast  . Emphysema Other   . Cancer Paternal Aunt        breast      Allergies  Allergen Reactions  . Eggs Or Egg-Derived Products   . Other     Clams, hazelnuts., Chemical sensitivities, many fruits, veggies, grass, trees     Current Outpatient Medications:  .  albuterol (VENTOLIN HFA)  108 (90 Base) MCG/ACT inhaler, Inhale 2 puffs into the lungs every 4 (four) hours as needed., Disp: , Rfl:  .  budesonide-formoterol (SYMBICORT) 160-4.5 MCG/ACT inhaler, Inhale 2 puffs into the lungs 2 (two) times daily. (Patient taking differently: Inhale 2 puffs into the lungs 2 (two) times daily as needed (shortness of breath).), Disp: 3 Inhaler, Rfl: 3 .  cetirizine (ZYRTEC) 10 MG tablet, Take 10 mg by mouth daily., Disp: , Rfl:  .  Cholecalciferol (VITAMIN D-3) 5000 UNITS TABS, Take by mouth daily. , Disp: , Rfl:  .  clemastine (TAVIST) 2.68 MG TABS tablet, Take 2.68 mg by mouth 2 (two) times daily., Disp: , Rfl:  .  famotidine (PEPCID) 40 MG  tablet, Take 40 mg by mouth 2 (two) times daily., Disp: , Rfl:  .  hydrOXYzine (ATARAX/VISTARIL) 25 MG tablet, Take 25 mg by mouth 3 (three) times daily as needed for itching., Disp: , Rfl:  .  ipratropium-albuterol (DUONEB) 0.5-2.5 (3) MG/3ML SOLN, Inhale 3 mLs into the lungs every 6 (six) hours as needed (shortness of breath wheezing). , Disp: , Rfl:  .  montelukast (SINGULAIR) 10 MG tablet, Take 10 mg by mouth at bedtime., Disp: , Rfl:  .  Multiple Vitamin (MULTIVITAMIN) capsule, Take 1 capsule by mouth daily., Disp: , Rfl:  .  omeprazole (PRILOSEC) 20 MG capsule, Take 40 mg by mouth daily., Disp: , Rfl:  .  tiotropium (SPIRIVA) 18 MCG inhalation capsule, Place 18 mcg into inhaler and inhale daily., Disp: , Rfl:  .  traMADol (ULTRAM) 50 MG tablet, Take 1 tablet (50 mg total) by mouth every 6 (six) hours as needed for moderate pain., Disp: 20 tablet, Rfl: 0   PHYSICAL EXAM SECTION: BP (!) 146/75   Pulse 67   Ht 5' 7.5" (1.715 m)   Wt 176 lb (79.8 kg)   BMI 27.16 kg/m   Body mass index is 27.16 kg/m.   General appearance: Well-developed well-nourished no gross deformities  Eyes clear normal vision no evidence of conjunctivitis or jaundice, extraocular muscles intact  ENT: ears hearing normal,   Lymph nodes: No lymphadenopathy  Cardiovascular normal pulse and perfusion   Neurologically deep tendon reflexes are equal and normal, no sensation loss or deficits no pathologic reflexes  Psychological: Awake alert and oriented x3 mood and affect normal  Skin no lacerations or ulcerations   Musculoskeletal:  HAND:   Indeed the patient has a left index finger DIP joint Heberden's/Bouchard's type node or spur This causes some deformity however the crossover deformity is coming from the PIP joint when she closes the hand  She denies any trauma there does not appear to be any ligament laxity  She has a contracture in the left palm in line with the ring finger consistent with  Dupuytren's contracture this does not affect range of motion as of yet  2:56 PM

## 2020-03-08 NOTE — Addendum Note (Signed)
Addended byCandice Camp on: 03/08/2020 03:32 PM   Modules accepted: Orders

## 2020-04-05 ENCOUNTER — Telehealth: Payer: Self-pay | Admitting: Orthopedic Surgery

## 2020-04-05 NOTE — Telephone Encounter (Signed)
I called and advised to call [facility] to get this CD burned.

## 2020-04-05 NOTE — Telephone Encounter (Signed)
I do not have a burner, Renee tells them to call [facility]. Is her work station set up?

## 2020-04-05 NOTE — Telephone Encounter (Signed)
This patient called and wants to know if we could burn a copy of her x-rays so that she can pick up this afternoon before her appointment on 04/06/2020. Please advise. If you do not have access please let wendy know and she can work on this. Thanks

## 2020-04-21 DIAGNOSIS — M720 Palmar fascial fibromatosis [Dupuytren]: Secondary | ICD-10-CM | POA: Insufficient documentation

## 2020-08-06 ENCOUNTER — Other Ambulatory Visit: Payer: Self-pay | Admitting: Family Medicine

## 2020-08-06 DIAGNOSIS — Z1231 Encounter for screening mammogram for malignant neoplasm of breast: Secondary | ICD-10-CM

## 2020-08-09 ENCOUNTER — Other Ambulatory Visit: Payer: Self-pay

## 2020-08-09 ENCOUNTER — Ambulatory Visit
Admission: RE | Admit: 2020-08-09 | Discharge: 2020-08-09 | Disposition: A | Discharge status: Home / Self Care | Payer: No Typology Code available for payment source | Source: Ambulatory Visit | Attending: Family Medicine | Admitting: Family Medicine

## 2020-08-09 DIAGNOSIS — Z1231 Encounter for screening mammogram for malignant neoplasm of breast: Secondary | ICD-10-CM

## 2020-09-16 ENCOUNTER — Encounter: Payer: No Typology Code available for payment source | Admitting: Family Medicine

## 2020-09-20 ENCOUNTER — Other Ambulatory Visit: Payer: Self-pay

## 2020-09-20 ENCOUNTER — Ambulatory Visit (INDEPENDENT_AMBULATORY_CARE_PROVIDER_SITE_OTHER): Payer: No Typology Code available for payment source | Admitting: Nurse Practitioner

## 2020-09-20 ENCOUNTER — Encounter: Payer: Self-pay | Admitting: Nurse Practitioner

## 2020-09-20 VITALS — BP 138/72 | HR 73 | Temp 96.9°F | Ht 67.5 in | Wt 182.0 lb

## 2020-09-20 DIAGNOSIS — E785 Hyperlipidemia, unspecified: Secondary | ICD-10-CM | POA: Diagnosis not present

## 2020-09-20 DIAGNOSIS — Z139 Encounter for screening, unspecified: Secondary | ICD-10-CM | POA: Diagnosis not present

## 2020-09-20 DIAGNOSIS — I10 Essential (primary) hypertension: Secondary | ICD-10-CM

## 2020-09-20 DIAGNOSIS — Z0001 Encounter for general adult medical examination with abnormal findings: Secondary | ICD-10-CM

## 2020-09-20 DIAGNOSIS — Z124 Encounter for screening for malignant neoplasm of cervix: Secondary | ICD-10-CM | POA: Insufficient documentation

## 2020-09-20 NOTE — Assessment & Plan Note (Signed)
-  she declined PAP today; prefers female provider -will set up with Dr. Moshe Cipro in 6 months

## 2020-09-20 NOTE — Assessment & Plan Note (Signed)
ordered labs

## 2020-09-20 NOTE — Assessment & Plan Note (Addendum)
BP Readings from Last 3 Encounters:  09/20/20 (!) 160/82  03/08/20 (!) 146/75  02/10/20 126/70   -not currently taking anything for BP -she states she checks BP when she goes to the pharmacy and it is usually around 120/80 -manual BP today 138/72

## 2020-09-20 NOTE — Progress Notes (Signed)
Established Patient Office Visit  Subjective:  Patient ID: Amber Stone, female    DOB: [birth year]  Age: 60 y.o. MRN: [id number]  CC:  Chief Complaint  Patient presents with   Annual Exam    CPE    HPI Amber Stone presents for physical exam.  She saw Dr. Fredna Dow in February for left hand pain, Dupuytren's fibromatosis and deviation of finger of left hand. He suggested buddy strapping her fingers and no surgery.  She sees an allergist, Dr. Marin Roberts, for asthma and food allergies. She is getting allergy injections currently.  She has right hip pain from arthritis. She states it is worse after she has done a lot of squatting, like when gardening.  Past Medical History:  Diagnosis Date   Allergic rhinitis    Allergy to chemicals    Arthritis    hands   Asthma    Asthma due to environmental allergies    Chest pain 6/50/3546   Complication of anesthesia    hard to wake up   Depression    GERD (gastroesophageal reflux disease)    OSA (obstructive sleep apnea)    lost weight, not used CPAP in over 5 yrs   Soft tissue mass, left anterior costal margin 03/26/2013   TONSILLECTOMY, HX OF 12/19/2006   Annotation: 1978 Qualifier: Diagnosis of  By: Wynetta Emery RN, Erika     Vocal cord dysfunction 12/19/2006   Qualifier: Diagnosis of  By: John Giovanni      Past Surgical History:  Procedure Laterality Date   BREAST BIOPSY Right 05/22/2019   benign   BREAST LUMPECTOMY WITH RADIOACTIVE SEED LOCALIZATION Right 04/24/2019   Procedure: RIGHT BREAST LUMPECTOMY WITH RADIOACTIVE SEED X 2;  Surgeon: Coralie Keens, MD;  Location: [facility];  Service: General;  Laterality: Right;   BREAST SURGERY     bx right side   CHOLECYSTECTOMY     COLONOSCOPY N/A 12/30/2014   Procedure: COLONOSCOPY;  Surgeon: Rogene Houston, MD;  [facility];  Service: Endoscopy;  Laterality: N/A;  32   GANGLION CYST EXCISION     left hand   LASIK  02/28/1995   LIPOMA  EXCISION     lower back   NASAL SINUS SURGERY     tonsillectomy  02/28/1976    Family History  Problem Relation Age of Onset   Coronary artery disease Mother    Rheum arthritis Mother    Coronary artery disease Father    Heart disease Father    Cancer Maternal Grandfather        leukemia   Cancer Paternal Grandmother        breast   Emphysema Other    Cancer Paternal Aunt        breast    Social History   Socioeconomic History   Marital status: Married    Spouse name: Not on file   Number of children: Not on file   Years of education: Not on file   Highest education level: Not on file  Occupational History    Comment: [employer]   Tobacco Use   Smoking status: Never   Smokeless tobacco: Never  Substance and Sexual Activity   Alcohol use: No   Drug use: No   Sexual activity: Not Currently    Birth control/protection: Post-menopausal  Other Topics Concern   Not on file  Social History Narrative   Lives with husband Al 24 years together  Enjoys: crafts, read, bake      Diet: limited due to food allergies- see allergy list    Caffeine:  3-4 cups daily   Water: 4 cups daily working to increase       Wears seat belt    Does not use phone while driving    Smoke Teacher, English as a foreign language    No weapons    Social Determinants of Radio broadcast assistant Strain: Low Risk    Difficulty of Paying Living Expenses: Not hard at all  Food Insecurity: No Food Insecurity   Worried About Charity fundraiser in the Last Year: Never true   Arboriculturist in the Last Year: Never true  Transportation Needs: No Transportation Needs   Lack of Transportation (Medical): No   Lack of Transportation (Non-Medical): No  Physical Activity: Sufficiently Active   Days of Exercise per Week: 7 days   Minutes of Exercise per Session: 30 min  Stress: No Stress Concern Present   Feeling of Stress : Not at all  Social Connections: Moderately Isolated    Frequency of Communication with Friends and Family: More than three times a week   Frequency of Social Gatherings with Friends and Family: Once a week   Attends Religious Services: Never   Marine scientist or Organizations: No   Attends Music therapist: Never   Marital Status: Married  Human resources officer Violence: Not At Risk   Fear of Current or Ex-Partner: No   Emotionally Abused: No   Physically Abused: No   Sexually Abused: No    Outpatient Medications Prior to Visit  Medication Sig Dispense Refill   albuterol (VENTOLIN HFA) 108 (90 Base) MCG/ACT inhaler Inhale 2 puffs into the lungs every 4 (four) hours as needed.     budesonide-formoterol (SYMBICORT) 160-4.5 MCG/ACT inhaler Inhale 2 puffs into the lungs 2 (two) times daily. (Patient taking differently: Inhale 2 puffs into the lungs 2 (two) times daily as needed (shortness of breath).) 3 Inhaler 3   cetirizine (ZYRTEC) 10 MG tablet Take 10 mg by mouth daily.     Cholecalciferol (VITAMIN D-3) 5000 UNITS TABS Take by mouth daily.      clemastine (TAVIST) 2.68 MG TABS tablet Take 2.68 mg by mouth 2 (two) times daily.     escitalopram (LEXAPRO) 10 MG tablet Take 10 mg by mouth daily.     hydrOXYzine (ATARAX/VISTARIL) 25 MG tablet Take 25 mg by mouth 3 (three) times daily as needed for itching.     ipratropium-albuterol (DUONEB) 0.5-2.5 (3) MG/3ML SOLN Inhale 3 mLs into the lungs every 6 (six) hours as needed (shortness of breath wheezing).      montelukast (SINGULAIR) 10 MG tablet Take 10 mg by mouth at bedtime.     Multiple Vitamin (MULTIVITAMIN) capsule Take 1 capsule by mouth daily.     omeprazole (PRILOSEC) 20 MG capsule Take 40 mg by mouth daily.     tiotropium (SPIRIVA) 18 MCG inhalation capsule Place 18 mcg into inhaler and inhale daily.     famotidine (PEPCID) 40 MG tablet Take 40 mg by mouth 2 (two) times daily. (Patient not taking: Reported on 09/20/2020)     No facility-administered medications prior to  visit.    Allergies  Allergen Reactions   Eggs Or Egg-Derived Products    Other     Clams, hazelnuts., Chemical sensitivities, many fruits, veggies, grass, trees    ROS Review of Systems  Constitutional: Negative.  HENT: Negative.    Eyes: Negative.   Respiratory: Negative.    Cardiovascular: Negative.   Gastrointestinal: Negative.   Endocrine: Negative.   Genitourinary: Negative.   Musculoskeletal:  Positive for arthralgias.       Hands and right hip  Skin: Negative.   Allergic/Immunologic: Negative.   Neurological: Negative.   Hematological: Negative.   Psychiatric/Behavioral: Negative.       Objective:    Physical Exam Constitutional:      Appearance: Normal appearance.  HENT:     Head: Normocephalic and atraumatic.     Right Ear: Tympanic membrane, ear canal and external ear normal.     Left Ear: Tympanic membrane, ear canal and external ear normal.     Nose: Nose normal.     Mouth/Throat:     Mouth: Mucous membranes are dry.     Pharynx: Oropharynx is clear.  Eyes:     Extraocular Movements: Extraocular movements intact.     Conjunctiva/sclera: Conjunctivae normal.     Pupils: Pupils are equal, round, and reactive to light.  Cardiovascular:     Rate and Rhythm: Normal rate and regular rhythm.     Pulses: Normal pulses.     Heart sounds: Normal heart sounds.  Pulmonary:     Effort: Pulmonary effort is normal.     Breath sounds: Normal breath sounds.  Abdominal:     General: Abdomen is flat. Bowel sounds are normal.     Palpations: Abdomen is soft.  Musculoskeletal:        General: Normal range of motion.     Cervical back: Normal range of motion and neck supple.  Skin:    General: Skin is warm and dry.     Capillary Refill: Capillary refill takes less than 2 seconds.  Neurological:     General: No focal deficit present.     Mental Status: She is alert and oriented to person, place, and time.     Cranial Nerves: No cranial nerve deficit.      Sensory: No sensory deficit.     Motor: No weakness.     Coordination: Coordination normal.     Gait: Gait normal.  Psychiatric:        Mood and Affect: Mood normal.        Behavior: Behavior normal.        Thought Content: Thought content normal.        Judgment: Judgment normal.    BP 138/72 (BP Location: Right Arm, Patient Position: Sitting, Cuff Size: Large)   Pulse 73   Temp (!) 96.9 F (36.1 C) (Temporal)   Ht 5' 7.5" (1.715 m)   Wt 182 lb (82.6 kg)   SpO2 96%   BMI 28.08 kg/m  Wt Readings from Last 3 Encounters:  09/20/20 182 lb (82.6 kg)  03/08/20 176 lb (79.8 kg)  02/10/20 181 lb (82.1 kg)     Health Maintenance Due  Topic Date Due   HIV Screening  Never done   Hepatitis C Screening  Never done   PAP SMEAR-Modifier  Never done    There are no preventive care reminders to display for this patient.  Lab Results  Component Value Date   TSH 0.68 03/16/2014   Lab Results  Component Value Date   WBC 9.3 03/16/2014   HGB 13.9 03/16/2014   HCT 40.9 03/16/2014   MCV 96.1 03/16/2014   PLT 411.0 (H) 03/16/2014   Lab Results  Component Value Date   NA 137  03/16/2014   K 3.8 03/16/2014   CO2 26 03/16/2014   GLUCOSE 105 (H) 03/16/2014   BUN 19 03/16/2014   CREATININE 1.00 03/16/2014   BILITOT 0.4 12/22/2008   ALKPHOS 77 12/22/2008   AST 16 12/22/2008   ALT 14 12/22/2008   PROT 6.6 12/22/2008   ALBUMIN 3.5 12/22/2008   CALCIUM 9.3 03/16/2014   GFR 61.17 03/16/2014   No results found for: CHOL No results found for: HDL No results found for: LDLCALC No results found for: TRIG No results found for: CHOLHDL No results found for: HGBA1C    Assessment & Plan:   Problem List Items Addressed This Visit       Cardiovascular and Mediastinum   Essential hypertension    BP Readings from Last 3 Encounters:  09/20/20 (!) 160/82  03/08/20 (!) 146/75  02/10/20 126/70  -not currently taking anything for BP -she states she checks BP when she goes to the  pharmacy and it is usually around 120/80 -manual BP today      Relevant Orders   CBC with Differential/Platelet   CMP14+EGFR   Lipid Panel With LDL/HDL Ratio     Other   Dyslipidemia    -ordered labs       Cervical cancer screening    -she declined PAP today; prefers female provider -will set up with Dr. Moshe Cipro in 6 months       Other Visit Diagnoses     Encounter for general adult medical examination with abnormal findings    -  Primary   Relevant Orders   Hepatitis C antibody   HIV Antibody (routine testing w rflx)   CBC with Differential/Platelet   CMP14+EGFR   Lipid Panel With LDL/HDL Ratio   TSH   Screening due       Relevant Orders   Hepatitis C antibody   HIV Antibody (routine testing w rflx)       No orders of the defined types were placed in this encounter.   Follow-up: Return in about 6 months (around 03/23/2021) for Lab follow-up (HTN, HLD) with PAP.    Noreene Larsson, NP

## 2020-09-20 NOTE — Patient Instructions (Signed)
Please have fasting labs drawn in the next 7 days.

## 2020-09-25 LAB — CMP14+EGFR
ALT: 14 IU/L (ref 0–32)
AST: 17 IU/L (ref 0–40)
Albumin/Globulin Ratio: 1.8 (ref 1.2–2.2)
Albumin: 4.5 g/dL (ref 3.8–4.8)
Alkaline Phosphatase: 85 IU/L (ref 44–121)
BUN/Creatinine Ratio: 11 — ABNORMAL LOW (ref 12–28)
BUN: 9 mg/dL (ref 8–27)
Bilirubin Total: <0.2 mg/dL (ref 0.0–1.2)
CO2: 22 mmol/L (ref 20–29)
Calcium: 9.6 mg/dL (ref 8.7–10.3)
Chloride: 95 mmol/L — ABNORMAL LOW (ref 96–106)
Creatinine, Ser: 0.81 mg/dL (ref 0.57–1.00)
Globulin, Total: 2.5 g/dL (ref 1.5–4.5)
Glucose: 93 mg/dL (ref 65–99)
Potassium: 5 mmol/L (ref 3.5–5.2)
Sodium: 133 mmol/L — ABNORMAL LOW (ref 134–144)
Total Protein: 7 g/dL (ref 6.0–8.5)
eGFR: 83 mL/min/{1.73_m2} (ref >59)

## 2020-09-25 LAB — CBC WITH DIFFERENTIAL/PLATELET
Basophils Absolute: 0.1 10*3/uL (ref 0.0–0.2)
Basos: 1 %
EOS (ABSOLUTE): 0.4 10*3/uL (ref 0.0–0.4)
Eos: 6 %
Hematocrit: 34.8 % (ref 34.0–46.6)
Hemoglobin: 12.2 g/dL (ref 11.1–15.9)
Immature Grans (Abs): 0 10*3/uL (ref 0.0–0.1)
Immature Granulocytes: 1 %
Lymphocytes Absolute: 2.1 10*3/uL (ref 0.7–3.1)
Lymphs: 30 %
MCH: 32.4 pg (ref 26.6–33.0)
MCHC: 35.1 g/dL (ref 31.5–35.7)
MCV: 92 fL (ref 79–97)
Monocytes Absolute: 0.8 10*3/uL (ref 0.1–0.9)
Monocytes: 11 %
Neutrophils Absolute: 3.8 10*3/uL (ref 1.4–7.0)
Neutrophils: 51 %
Platelets: 433 10*3/uL (ref 150–450)
RBC: 3.77 x10E6/uL (ref 3.77–5.28)
RDW: 14 % (ref 11.7–15.4)
WBC: 7.2 10*3/uL (ref 3.4–10.8)

## 2020-09-25 LAB — LIPID PANEL WITH LDL/HDL RATIO
Cholesterol, Total: 315 mg/dL — ABNORMAL HIGH (ref 100–199)
HDL: 70 mg/dL (ref >39)
LDL Chol Calc (NIH): 236 mg/dL — ABNORMAL HIGH (ref 0–99)
LDL/HDL Ratio: 3.4 ratio — ABNORMAL HIGH (ref 0.0–3.2)
Triglycerides: 66 mg/dL (ref 0–149)
VLDL Cholesterol Cal: 9 mg/dL (ref 5–40)

## 2020-09-25 LAB — HIV ANTIBODY (ROUTINE TESTING W REFLEX): HIV Screen 4th Generation wRfx: NONREACTIVE

## 2020-09-25 LAB — HEPATITIS C ANTIBODY: Hep C Virus Ab: 0.1 s/co ratio (ref 0.0–0.9)

## 2020-09-25 LAB — TSH: TSH: 0.545 u[IU]/mL (ref 0.450–4.500)

## 2020-09-27 ENCOUNTER — Other Ambulatory Visit: Payer: Self-pay | Admitting: Nurse Practitioner

## 2020-09-27 DIAGNOSIS — E785 Hyperlipidemia, unspecified: Secondary | ICD-10-CM

## 2020-09-27 MED ORDER — ATORVASTATIN CALCIUM 80 MG PO TABS: 80.0000 mg | ORAL_TABLET | Freq: Every day | ORAL | 3 refills | Status: DC

## 2020-09-27 NOTE — Progress Notes (Signed)
Cholesterol is elevated with LDL 236. The upper limit of normal is 100, and your numbers are so high there is a suspicious for familial hypercholesterolemia. Cutting back on fried/fatty foods is a good start, but given the elevation in your numbers, is not likely to bring your LDL to goal (<100). I called in high-dose atorvastatin. If you have tried statins in the past and are intolerant, please let us know.

## 2020-09-28 ENCOUNTER — Other Ambulatory Visit: Payer: Self-pay | Admitting: Nurse Practitioner

## 2020-09-28 DIAGNOSIS — E785 Hyperlipidemia, unspecified: Secondary | ICD-10-CM

## 2020-09-28 MED ORDER — PRAVASTATIN SODIUM 80 MG PO TABS: 80.0000 mg | ORAL_TABLET | Freq: Every day | ORAL | 3 refills | Status: DC

## 2020-09-28 NOTE — Progress Notes (Signed)
I sent in pravastatin.

## 2020-09-28 NOTE — Progress Notes (Signed)
If the atorvastatin causes muscle aches, we will try pravastatin, which is less likely to cause side effects. It that doesn't help, there is an injectable medication, Repatha, that should work even better than the statins, but it is branded and, therefore, more expensive. So, I'd rather start you on the cheap and effective stuff before trying the expensive and effective stuff.

## 2020-10-06 ENCOUNTER — Other Ambulatory Visit: Payer: Self-pay

## 2020-10-06 MED ORDER — ESCITALOPRAM OXALATE 10 MG PO TABS: 10.0000 mg | ORAL_TABLET | Freq: Every day | ORAL | 3 refills | Status: DC

## 2021-01-04 ENCOUNTER — Other Ambulatory Visit: Payer: Self-pay | Admitting: Nurse Practitioner

## 2021-09-12 IMAGING — MG MM PLC BREAST LOC DEV EA ADD LEASION INC MAMMO GUIDE*R*
6 series · 6 of 6 positions shown · non-contrast
Comparison: Previous exam(s).

CLINICAL DATA: 59-year-old patient presents for 2 radioactive seed
localizations prior to excisional biopsy for recent diagnosis of
atypical ductal hyperplasia at 2 biopsy sites in the upper-outer
right breast. The biopsy clips placed at the recent stereotactic
biopsies include a coil shaped biopsy clip more posteriorly and an
"x" shaped biopsy clip more anteriorly.

There is a pre-existing tophat shaped clip placed in the past that
does not require excision but is close to the coil clip.
EXAM:
MAMMOGRAPHIC GUIDED RADIOACTIVE SEED LOCALIZATION OF THE RIGHT
BREAST

[R ML (1 of 3)]
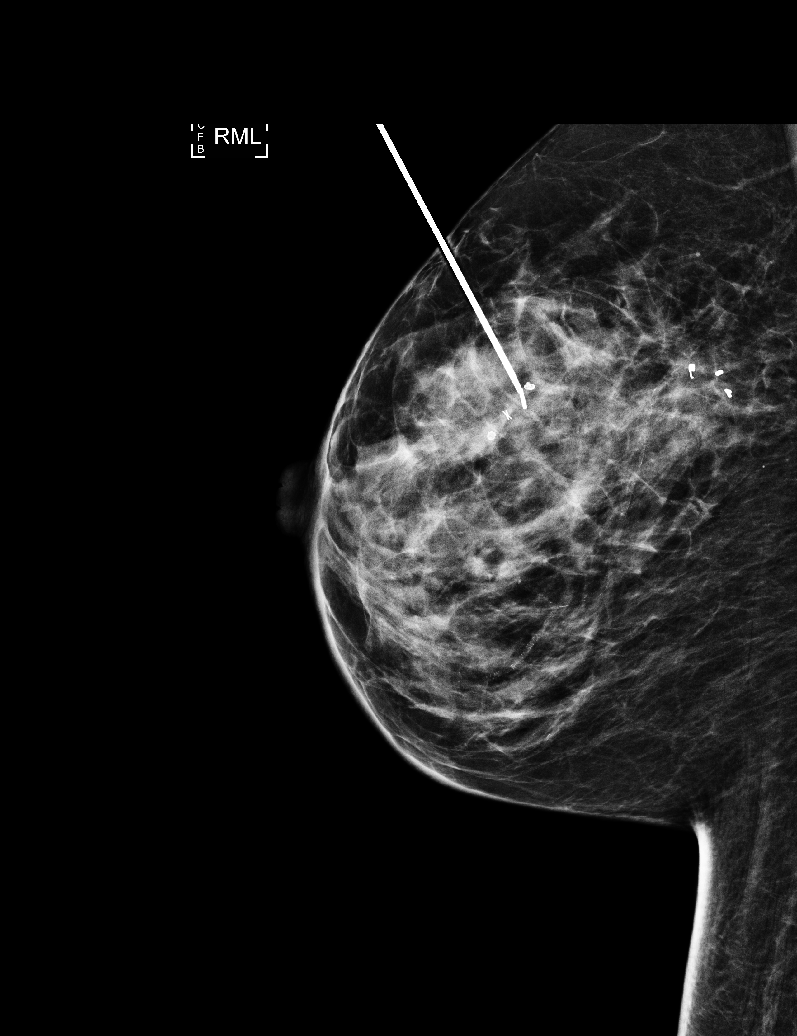

[R ML (2 of 3)]
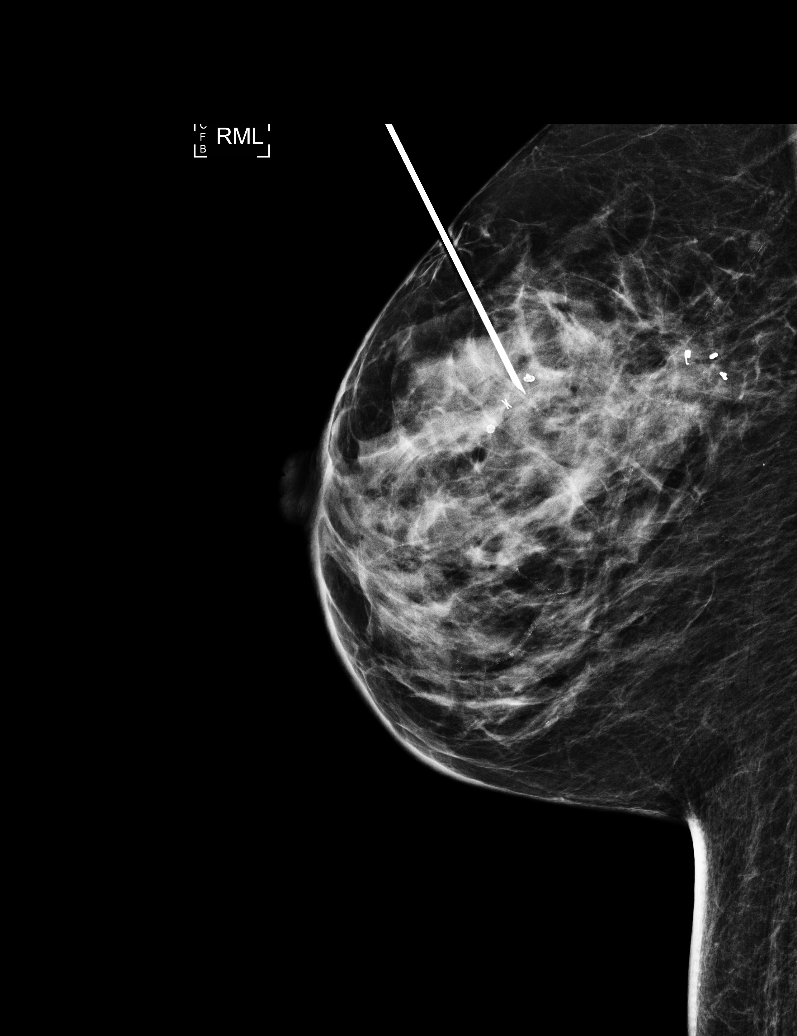

[R CC (1 of 3)]
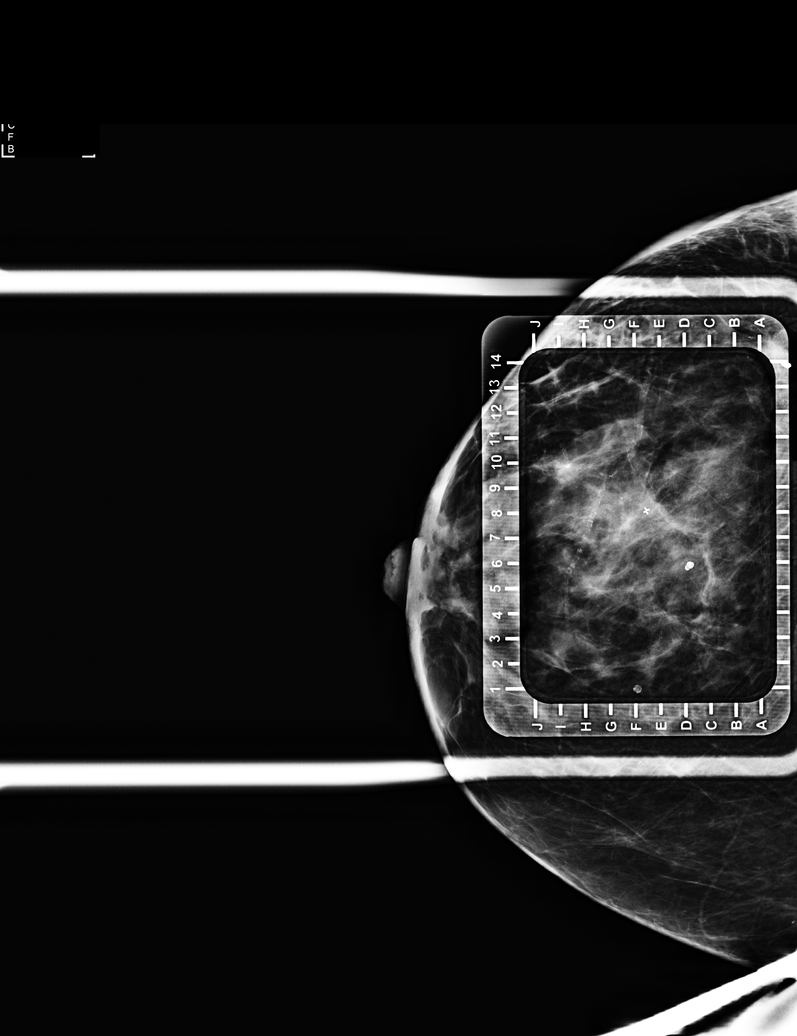

[R CC (2 of 3)]
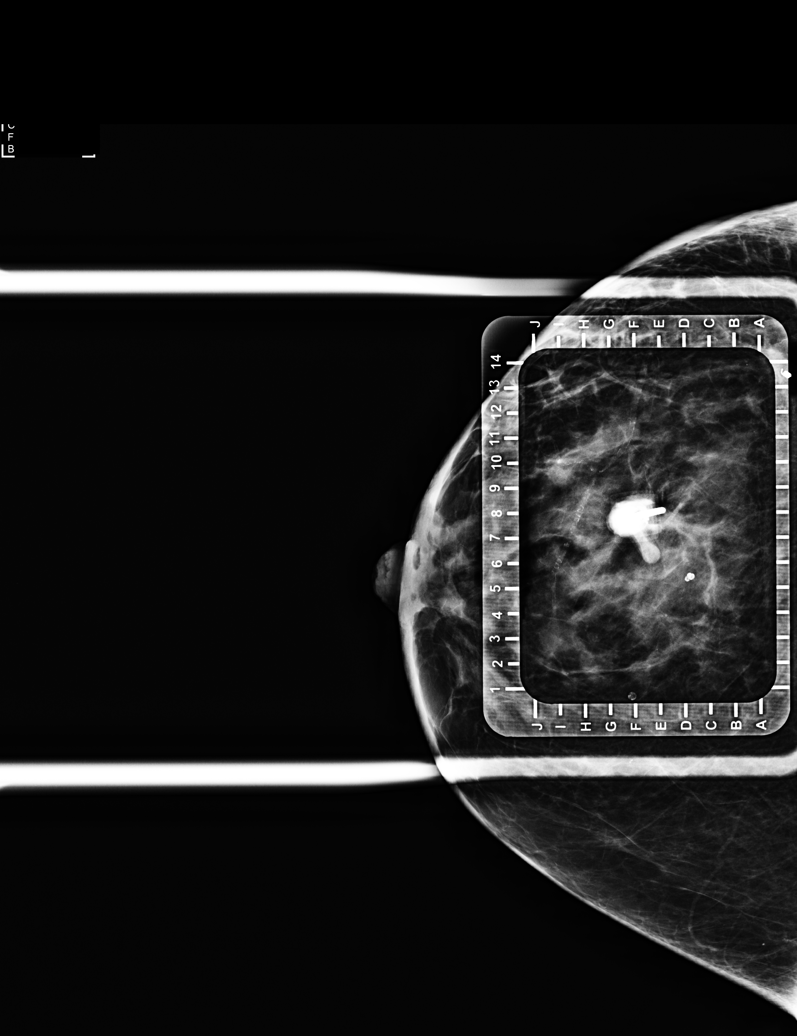

[R ML (3 of 3)]
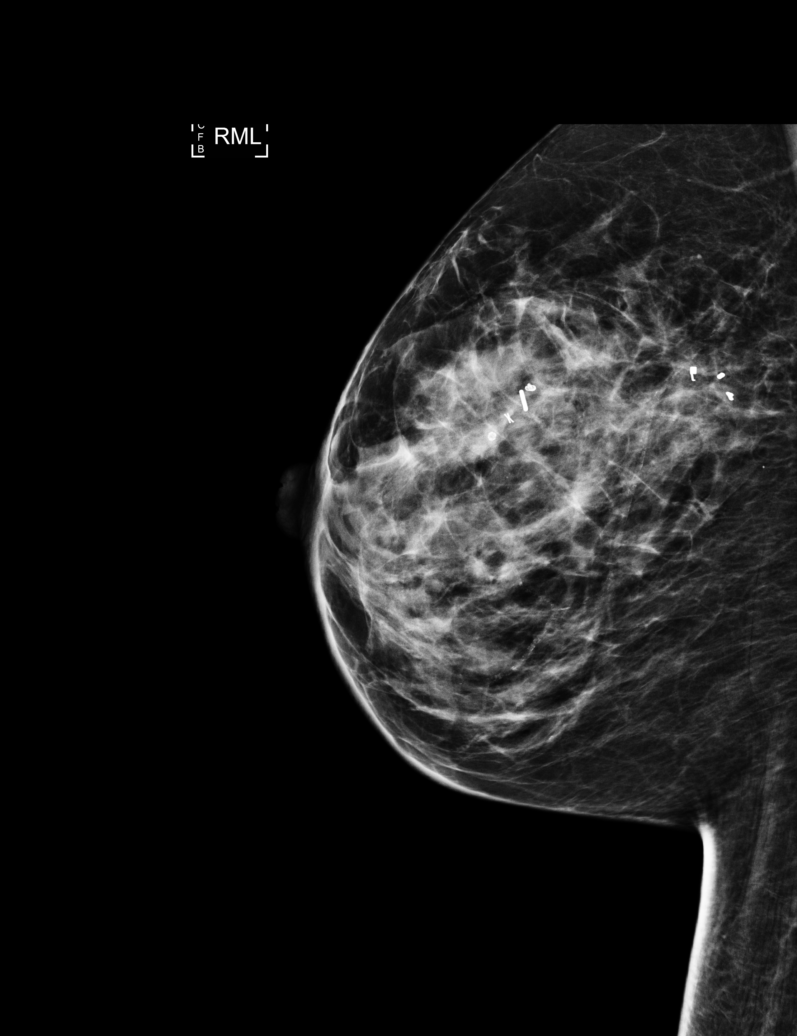

[R CC (3 of 3)]
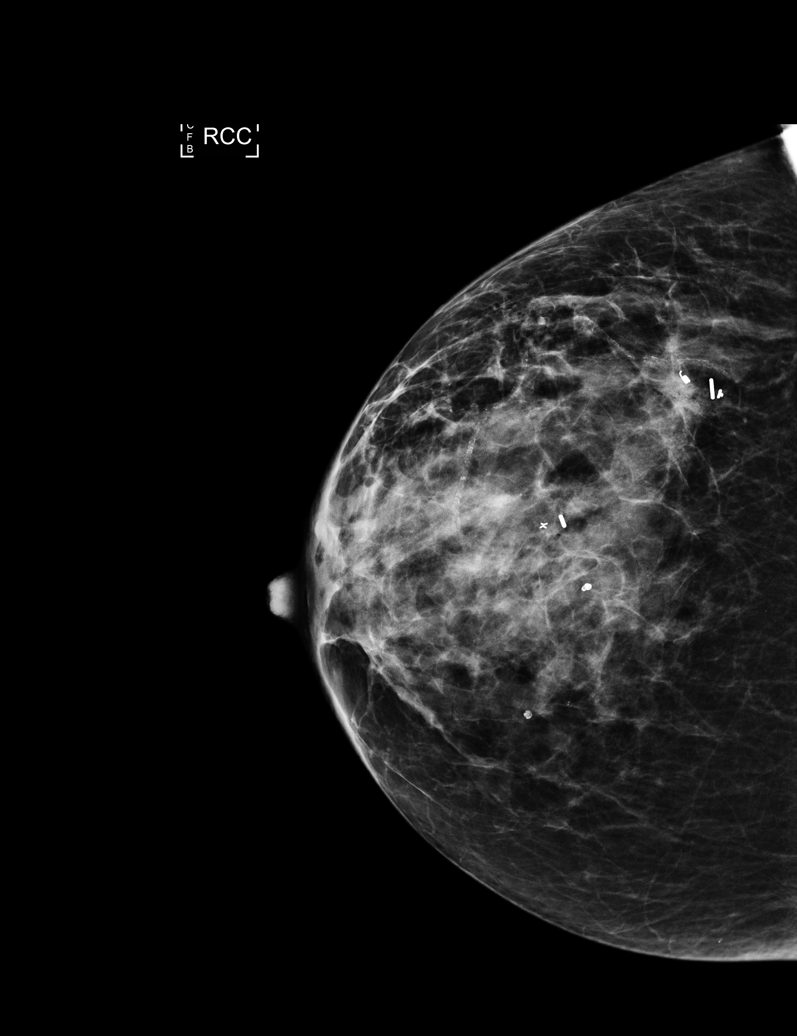

[6 of 6 positions shown; findings below may reference images not displayed]

FINDINGS: Patient presents for radioactive seed localization prior to 2
radioactive seed localizations. I met with the patient and we
discussed the procedure of seed localization including benefits and
alternatives. We discussed the high likelihood of a successful
procedure. We discussed the risks of the procedure including
infection, bleeding, tissue injury and further surgery. We discussed
the low dose of radioactivity involved in the procedure. Informed,
written consent was given.

The usual time-out protocol was performed immediately prior to the
procedure.

Using mammographic guidance, sterile technique, 1% lidocaine and an
2-39V radioactive seed, a coil shaped biopsy clip in the posterior
third of the upper-outer right breast was localized using a lateral
approach. The follow-up mammogram images confirm the seed in the
expected location and were marked for Dr. Pacquione.

Follow-up survey of the patient confirms presence of the radioactive
seed.

Order number of 2-39V seed:  [id number].

Total activity: 0.249 mCi reference Date: 18 March, 2019

Using mammographic guidance, sterile technique, 1% lidocaine and an
2-39V radioactive seed, an X shaped biopsy clip in the middle third
of the upper-outer right breast was localized using a superior
approach. The follow-up mammogram images confirm the seed in the
expected location and were marked for Dr. Pacquione.

Follow-up survey of the patient confirms presence of the radioactive
seed.

Order number of 2-39V seed:  [id number].

Total activity: 0.249 mCi reference Date: 18 March, 2019

The patient tolerated the procedure well and was released from the
[REDACTED]. She was given instructions regarding seed removal.
IMPRESSION: Radioactive seed localization right breast X 2. No apparent
complications.

## 2021-12-20 ENCOUNTER — Encounter (INDEPENDENT_AMBULATORY_CARE_PROVIDER_SITE_OTHER): Payer: Self-pay | Admitting: *Deleted

## 2022-04-27 ENCOUNTER — Encounter: Payer: Self-pay | Admitting: Radiology

## 2022-06-29 ENCOUNTER — Ambulatory Visit (HOSPITAL_BASED_OUTPATIENT_CLINIC_OR_DEPARTMENT_OTHER): Payer: No Typology Code available for payment source | Admitting: Family Medicine

## 2022-07-19 ENCOUNTER — Ambulatory Visit (HOSPITAL_BASED_OUTPATIENT_CLINIC_OR_DEPARTMENT_OTHER): Payer: No Typology Code available for payment source | Admitting: Family Medicine

## 2022-07-19 ENCOUNTER — Encounter (HOSPITAL_BASED_OUTPATIENT_CLINIC_OR_DEPARTMENT_OTHER): Payer: Self-pay | Admitting: Family Medicine

## 2022-07-19 VITALS — BP 135/76 | HR 72 | Temp 97.7°F | Ht 67.0 in | Wt 181.2 lb

## 2022-07-19 DIAGNOSIS — K219 Gastro-esophageal reflux disease without esophagitis: Secondary | ICD-10-CM

## 2022-07-19 DIAGNOSIS — J4520 Mild intermittent asthma, uncomplicated: Secondary | ICD-10-CM | POA: Diagnosis not present

## 2022-07-19 DIAGNOSIS — R011 Cardiac murmur, unspecified: Secondary | ICD-10-CM | POA: Insufficient documentation

## 2022-07-19 DIAGNOSIS — E785 Hyperlipidemia, unspecified: Secondary | ICD-10-CM | POA: Diagnosis not present

## 2022-07-19 DIAGNOSIS — Z0000 Encounter for general adult medical examination without abnormal findings: Secondary | ICD-10-CM

## 2022-07-19 MED ORDER — OMEPRAZOLE 40 MG PO CPDR: 40.0000 mg | DELAYED_RELEASE_CAPSULE | Freq: Every day | ORAL | 1 refills | Status: DC

## 2022-07-19 NOTE — Assessment & Plan Note (Signed)
Symptoms well-controlled with current medication regimen, can continue at this time

## 2022-07-19 NOTE — Assessment & Plan Note (Signed)
Symptoms controlled with use of omeprazole.  Discussed recommended medication administration for optimal benefit.  New prescription sent to pharmacy on file

## 2022-07-19 NOTE — Progress Notes (Signed)
New Patient Office Visit  Subjective    Patient ID: Amber Stone, female    DOB: [birth year]  Age: 62 y.o. MRN: [id number]  CC:  Chief Complaint  Patient presents with   Establish Care    Pt here to establish new care     HPI Amber Stone presents to establish care Last PCP - primarily was seeing her allergist; last PCP Bjorn Pippin  GERD: Patient reports symptoms are well-controlled with use omeprazole, requesting refill of medication today.  HLD: not taking statin - has been advised on taking statin in the past.  At 1 point, was prescribed atorvastatin, did not tolerate medication due to myalgias.  She subsequently was started on pravastatin, however has not been taking this medication.  She feels that she has been managing cholesterol well with lifestyle modifications.  Currently not taking prescribed pravastatin  Asthma: She does follow with allergist who manages her inhalers.  She currently has Symbicort and Spiriva available and utilizes albuterol as needed.  Denies any concerns today, feels that symptoms are well-controlled.  Patient is originally from [place] [facility] [place] [facility]. Has lived here for since about 2007. Patient is still working in Research scientist (life sciences). Outside of work, she enjoys gardening, spending time with her dog, reading.   Outpatient Encounter Medications as of 07/19/2022  Medication Sig   albuterol (VENTOLIN HFA) 108 (90 Base) MCG/ACT inhaler Inhale 2 puffs into the lungs every 4 (four) hours as needed.   budesonide-formoterol (SYMBICORT) 160-4.5 MCG/ACT inhaler Inhale 2 puffs into the lungs 2 (two) times daily. (Patient taking differently: Inhale 2 puffs into the lungs 2 (two) times daily as needed (shortness of breath).)   cetirizine (ZYRTEC) 10 MG tablet Take 10 mg by mouth daily.   Cholecalciferol (VITAMIN D-3) 5000 UNITS TABS Take by mouth daily.    clemastine (TAVIST) 2.68 MG TABS tablet Take 2.68 mg by mouth 2 (two) times daily.    hydrOXYzine (ATARAX/VISTARIL) 25 MG tablet Take 25 mg by mouth 3 (three) times daily as needed for itching.   ipratropium-albuterol (DUONEB) 0.5-2.5 (3) MG/3ML SOLN Inhale 3 mLs into the lungs every 6 (six) hours as needed (shortness of breath wheezing).    montelukast (SINGULAIR) 10 MG tablet Take 10 mg by mouth at bedtime.   Multiple Vitamin (MULTIVITAMIN) capsule Take 1 capsule by mouth daily.   tiotropium (SPIRIVA) 18 MCG inhalation capsule Place 18 mcg into inhaler and inhale daily.   [DISCONTINUED] escitalopram (LEXAPRO) 10 MG tablet TAKE ONE (1) TABLET BY MOUTH EVERY DAY   [DISCONTINUED] omeprazole (PRILOSEC) 20 MG capsule Take 40 mg by mouth daily.   [DISCONTINUED] pravastatin (PRAVACHOL) 80 MG tablet Take 1 tablet (80 mg total) by mouth daily.   omeprazole (PRILOSEC) 40 MG capsule Take 1 capsule (40 mg total) by mouth daily.   No facility-administered encounter medications on file as of 07/19/2022.    Past Medical History:  Diagnosis Date   Allergic rhinitis    Allergy to chemicals    Arthritis    hands   Asthma    Asthma due to environmental allergies    Chest pain 03/16/2014   Complication of anesthesia    hard to wake up   Depression    GERD (gastroesophageal reflux disease)    OSA (obstructive sleep apnea)    lost weight, not used CPAP in over 5 yrs   Soft tissue mass, left anterior costal margin 03/26/2013   TONSILLECTOMY, HX OF 12/19/2006   Annotation: 1978 Qualifier: Diagnosis of  By: Laural Benes RN, Erika     Vocal cord dysfunction 12/19/2006   Qualifier: Diagnosis of  By: Coralee North      Past Surgical History:  Procedure Laterality Date   BREAST BIOPSY Right 05/22/2019   benign   BREAST LUMPECTOMY WITH RADIOACTIVE SEED LOCALIZATION Right 04/24/2019   Procedure: RIGHT BREAST LUMPECTOMY WITH RADIOACTIVE SEED X 2;  Surgeon: Abigail Miyamoto, MD;  Location: [facility];  Service: General;  Laterality: Right;   BREAST SURGERY     bx right side    CHOLECYSTECTOMY     COLONOSCOPY N/A 12/30/2014   Procedure: COLONOSCOPY;  Surgeon: Malissa Hippo, MD;  [facility];  Service: Endoscopy;  Laterality: N/A;  730   GANGLION CYST EXCISION     left hand   LASIK  02/28/1995   LIPOMA EXCISION     lower back   NASAL SINUS SURGERY     tonsillectomy  02/28/1976    Family History  Problem Relation Age of Onset   Coronary artery disease Mother    Rheum arthritis Mother    Coronary artery disease Father    Heart disease Father    Cancer Maternal Grandfather        leukemia   Cancer Paternal Grandmother        breast   Emphysema Other    Cancer Paternal Aunt        breast    Social History   Socioeconomic History   Marital status: Married    Spouse name: Not on file   Number of children: Not on file   Years of education: Not on file   Highest education level: Not on file  Occupational History    Comment: [employer]   Tobacco Use   Smoking status: Never   Smokeless tobacco: Never  Substance and Sexual Activity   Alcohol use: No   Drug use: No   Sexual activity: Not Currently    Birth control/protection: Post-menopausal  Other Topics Concern   Not on file  Social History Narrative   Lives with husband Al 24 years together      Enjoys: crafts, read, bake      Diet: limited due to food allergies- see allergy list    Caffeine:  3-4 cups daily   Water: 4 cups daily working to increase       Wears seat belt    Does not use phone while driving    Smoke Production designer, theatre/television/film    No weapons    Social Determinants of Health   Financial Resource Strain: Low Risk  (02/10/2020)   Overall Financial Resource Strain (CARDIA)    Difficulty of Paying Living Expenses: Not hard at all  Food Insecurity: No Food Insecurity (02/10/2020)   Hunger Vital Sign    Worried About Running Out of Food in the Last Year: Never true    Ran Out of Food in the Last Year: Never true  Transportation Needs: No  Transportation Needs (02/10/2020)   PRAPARE - Administrator, Civil Service (Medical): No    Lack of Transportation (Non-Medical): No  Physical Activity: Sufficiently Active (02/10/2020)   Exercise Vital Sign    Days of Exercise per Week: 7 days    Minutes of Exercise per Session: 30 min  Stress: No Stress Concern Present (02/10/2020)   [employer] of Occupational Health - Occupational Stress Questionnaire    Feeling of Stress : Not at all  Social Connections: Moderately Isolated (02/10/2020)   Social Connection and Isolation Panel [NHANES]    Frequency of Communication with Friends and Family: More than three times a week    Frequency of Social Gatherings with Friends and Family: Once a week    Attends Religious Services: Never    Database administrator or Organizations: No    Attends Banker Meetings: Never    Marital Status: Married  Catering manager Violence: Not At Risk (02/10/2020)   Humiliation, Afraid, Rape, and Kick questionnaire    Fear of Current or Ex-Partner: No    Emotionally Abused: No    Physically Abused: No    Sexually Abused: No    Objective    BP 135/76 (BP Location: Left Arm, Patient Position: Sitting, Cuff Size: Normal)   Pulse 72   Temp 97.7 F (36.5 C) (Oral)   Ht 5\' 7"  (1.702 m)   Wt 181 lb 3.2 oz (82.2 kg)   SpO2 100%   BMI 28.38 kg/m   Physical Exam  62 year old female in no acute distress Cardiovascular exam with regular rate and rhythm, systolic murmur appreciated Lungs clear to auscultation bilaterally  Assessment & Plan:   Problem List Items Addressed This Visit       Respiratory   Asthma    Symptoms well-controlled with current medication regimen, can continue at this time        Digestive   GERD - Primary    Symptoms controlled with use of omeprazole.  Discussed recommended medication administration for optimal benefit.  New prescription sent to pharmacy on file      Relevant Medications    omeprazole (PRILOSEC) 40 MG capsule     Other   Dyslipidemia    Noted previously, utilizing prior lab results, current ASCVD risk score is about 5%.  Discussed role of statin therapy in lowering risk of adverse cardiovascular events.  At this time, patient would prefer to continue without statin medication.  We can plan to recheck cholesterol panel in the future to monitor progress with lifestyle modifications and further review consideration of statin therapy      Systolic murmur    Noted on exam today, patient denies history of murmur.  We discussed possible causes of systolic murmur, would suspect that current symptoms are related to aortic stenosis.  She is generally asymptomatic, only experiencing occasional dyspnea at times with exertion.  She has not had to have any changes to activities based on this.  Denies any lightheadedness or dizziness, no presyncope or syncope.  We discussed considerations and discussed proceeding with echocardiogram, patient would prefer to hold off on this for now which is reasonable.  Discussed potential concerns related to systolic murmur and likely cause as well as recommended next steps of evaluation.  We will continue to monitor moving forward      Other Visit Diagnoses     Wellness examination       Relevant Orders   CBC with Differential/Platelet   Comprehensive metabolic panel   Hemoglobin A1c   Lipid panel   TSH Rfx on Abnormal to Free T4       Return in about 4 months (around 11/19/2022) for CPE with [facility] a week prior.   Kamel Haven J De [place], MD

## 2022-07-19 NOTE — Assessment & Plan Note (Signed)
Noted previously, utilizing prior lab results, current ASCVD risk score is about 5%.  Discussed role of statin therapy in lowering risk of adverse cardiovascular events.  At this time, patient would prefer to continue without statin medication.  We can plan to recheck cholesterol panel in the future to monitor progress with lifestyle modifications and further review consideration of statin therapy

## 2022-07-19 NOTE — Assessment & Plan Note (Signed)
Noted on exam today, patient denies history of murmur.  We discussed possible causes of systolic murmur, would suspect that current symptoms are related to aortic stenosis.  She is generally asymptomatic, only experiencing occasional dyspnea at times with exertion.  She has not had to have any changes to activities based on this.  Denies any lightheadedness or dizziness, no presyncope or syncope.  We discussed considerations and discussed proceeding with echocardiogram, patient would prefer to hold off on this for now which is reasonable.  Discussed potential concerns related to systolic murmur and likely cause as well as recommended next steps of evaluation.  We will continue to monitor moving forward

## 2022-07-26 ENCOUNTER — Ambulatory Visit (HOSPITAL_BASED_OUTPATIENT_CLINIC_OR_DEPARTMENT_OTHER): Payer: No Typology Code available for payment source | Admitting: Family Medicine

## 2022-11-14 ENCOUNTER — Other Ambulatory Visit (HOSPITAL_BASED_OUTPATIENT_CLINIC_OR_DEPARTMENT_OTHER): Payer: Self-pay | Admitting: Family Medicine

## 2023-01-24 LAB — COMPREHENSIVE METABOLIC PANEL
ALT: 16 [IU]/L (ref 0–32)
AST: 20 [IU]/L (ref 0–40)
Albumin: 4.4 g/dL (ref 3.9–4.9)
Alkaline Phosphatase: 75 [IU]/L (ref 44–121)
BUN/Creatinine Ratio: 20 (ref 12–28)
BUN: 15 mg/dL (ref 8–27)
Bilirubin Total: <0.2 mg/dL (ref 0.0–1.2)
CO2: 21 mmol/L (ref 20–29)
Calcium: 9.4 mg/dL (ref 8.7–10.3)
Chloride: 96 mmol/L (ref 96–106)
Creatinine, Ser: 0.75 mg/dL (ref 0.57–1.00)
Globulin, Total: 2.5 g/dL (ref 1.5–4.5)
Glucose: 102 mg/dL — ABNORMAL HIGH (ref 70–99)
Potassium: 4.4 mmol/L (ref 3.5–5.2)
Sodium: 133 mmol/L — ABNORMAL LOW (ref 134–144)
Total Protein: 6.9 g/dL (ref 6.0–8.5)
eGFR: 89 mL/min/{1.73_m2} (ref >59)

## 2023-01-24 LAB — LIPID PANEL
Chol/HDL Ratio: 4.8 {ratio} — ABNORMAL HIGH (ref 0.0–4.4)
Cholesterol, Total: 293 mg/dL — ABNORMAL HIGH (ref 100–199)
HDL: 61 mg/dL (ref >39)
LDL Chol Calc (NIH): 213 mg/dL — ABNORMAL HIGH (ref 0–99)
Triglycerides: 111 mg/dL (ref 0–149)
VLDL Cholesterol Cal: 19 mg/dL (ref 5–40)

## 2023-01-24 LAB — CBC WITH DIFFERENTIAL/PLATELET
Basophils Absolute: 0.1 10*3/uL (ref 0.0–0.2)
Basos: 1 %
EOS (ABSOLUTE): 0.5 10*3/uL — ABNORMAL HIGH (ref 0.0–0.4)
Eos: 5 %
Hematocrit: 36.9 % (ref 34.0–46.6)
Hemoglobin: 12.5 g/dL (ref 11.1–15.9)
Immature Grans (Abs): 0.1 10*3/uL (ref 0.0–0.1)
Immature Granulocytes: 1 %
Lymphocytes Absolute: 3.6 10*3/uL — ABNORMAL HIGH (ref 0.7–3.1)
Lymphs: 36 %
MCH: 32.6 pg (ref 26.6–33.0)
MCHC: 33.9 g/dL (ref 31.5–35.7)
MCV: 96 fL (ref 79–97)
Monocytes Absolute: 1.1 10*3/uL — ABNORMAL HIGH (ref 0.1–0.9)
Monocytes: 11 %
Neutrophils Absolute: 4.7 10*3/uL (ref 1.4–7.0)
Neutrophils: 46 %
Platelets: 412 10*3/uL (ref 150–450)
RBC: 3.83 x10E6/uL (ref 3.77–5.28)
RDW: 13.2 % (ref 11.7–15.4)
WBC: 10 10*3/uL (ref 3.4–10.8)

## 2023-01-24 LAB — TSH RFX ON ABNORMAL TO FREE T4: TSH: 1.02 u[IU]/mL (ref 0.450–4.500)

## 2023-01-24 LAB — HEMOGLOBIN A1C
Est. average glucose Bld gHb Est-mCnc: 111 mg/dL
Hgb A1c MFr Bld: 5.5 % (ref 4.8–5.6)

## 2023-02-01 ENCOUNTER — Encounter (HOSPITAL_BASED_OUTPATIENT_CLINIC_OR_DEPARTMENT_OTHER): Payer: Self-pay | Admitting: Family Medicine

## 2023-02-01 ENCOUNTER — Ambulatory Visit (INDEPENDENT_AMBULATORY_CARE_PROVIDER_SITE_OTHER): Payer: No Typology Code available for payment source | Admitting: Family Medicine

## 2023-02-01 VITALS — BP 136/82 | HR 82 | Ht 68.0 in | Wt 173.8 lb

## 2023-02-01 DIAGNOSIS — Z1231 Encounter for screening mammogram for malignant neoplasm of breast: Secondary | ICD-10-CM

## 2023-02-01 DIAGNOSIS — E785 Hyperlipidemia, unspecified: Secondary | ICD-10-CM | POA: Diagnosis not present

## 2023-02-01 DIAGNOSIS — Z0000 Encounter for general adult medical examination without abnormal findings: Secondary | ICD-10-CM | POA: Diagnosis not present

## 2023-02-01 NOTE — Progress Notes (Signed)
Subjective:    CC: Annual Physical Exam  HPI: Amber Stone is a 62 y.o. presenting for annual physical  I reviewed the past medical history, family history, social history, surgical history, and allergies today and no changes were needed.  Please see the problem list section below in epic for further details.  Past Medical History: Past Medical History:  Diagnosis Date   Allergic rhinitis    Allergy to chemicals    Arthritis    hands   Asthma    Asthma due to environmental allergies    Chest pain 03/16/2014   Complication of anesthesia    hard to wake up   Depression    GERD (gastroesophageal reflux disease)    OSA (obstructive sleep apnea)    lost weight, not used CPAP in over 5 yrs   Soft tissue mass, left anterior costal margin 03/26/2013   TONSILLECTOMY, HX OF 12/19/2006   Annotation: 1978 Qualifier: Diagnosis of  By: Laural Benes RN, Erika     Vocal cord dysfunction 12/19/2006   Qualifier: Diagnosis of  By: Coralee North     Past Surgical History: Past Surgical History:  Procedure Laterality Date   BREAST BIOPSY Right 05/22/2019   benign   BREAST LUMPECTOMY WITH RADIOACTIVE SEED LOCALIZATION Right 04/24/2019   Procedure: RIGHT BREAST LUMPECTOMY WITH RADIOACTIVE SEED X 2;  Surgeon: Abigail Miyamoto, MD;  Location: [facility];  Service: General;  Laterality: Right;   BREAST SURGERY     bx right side   CHOLECYSTECTOMY     COLONOSCOPY N/A 12/30/2014   Procedure: COLONOSCOPY;  Surgeon: Malissa Hippo, MD;  Location: [facility];  Service: Endoscopy;  Laterality: N/A;  730   GANGLION CYST EXCISION     left hand   LASIK  02/28/1995   LIPOMA EXCISION     lower back   NASAL SINUS SURGERY     tonsillectomy  02/28/1976   Social History: Social History   Socioeconomic History   Marital status: Married    Spouse name: Not on file   Number of children: Not on file   Years of education: Not on file   Highest education level: Not on file   Occupational History    Comment: [employer]   Tobacco Use   Smoking status: Never    Passive exposure: Never   Smokeless tobacco: Never  Vaping Use   Vaping status: Never Used  Substance and Sexual Activity   Alcohol use: No   Drug use: No   Sexual activity: Not Currently    Birth control/protection: Post-menopausal  Other Topics Concern   Not on file  Social History Narrative   Lives with husband Al 24 years together      Enjoys: crafts, read, bake      Diet: limited due to food allergies- see allergy list    Caffeine:  3-4 cups daily   Water: 4 cups daily working to increase       Wears seat belt    Does not use phone while driving    Smoke Production designer, theatre/television/film    No weapons    Social Determinants of Health   Financial Resource Strain: Low Risk  (02/10/2020)   Overall Financial Resource Strain (CARDIA)    Difficulty of Paying Living Expenses: Not hard at all  Food Insecurity: No Food Insecurity (02/10/2020)   Hunger Vital Sign    Worried About Running Out of Food in the Last Year: Never  true    Ran Out of Food in the Last Year: Never true  Transportation Needs: No Transportation Needs (02/10/2020)   PRAPARE - Administrator, Civil Service (Medical): No    Lack of Transportation (Non-Medical): No  Physical Activity: Sufficiently Active (02/10/2020)   Exercise Vital Sign    Days of Exercise per Week: 7 days    Minutes of Exercise per Session: 30 min  Stress: No Stress Concern Present (02/10/2020)   [employer] of Occupational Health - Occupational Stress Questionnaire    Feeling of Stress : Not at all  Social Connections: Moderately Isolated (02/10/2020)   Social Connection and Isolation Panel [NHANES]    Frequency of Communication with Friends and Family: More than three times a week    Frequency of Social Gatherings with Friends and Family: Once a week    Attends Religious Services: Never    Database administrator or  Organizations: No    Attends Engineer, structural: Never    Marital Status: Married   Family History: Family History  Problem Relation Age of Onset   Coronary artery disease Mother    Rheum arthritis Mother    Coronary artery disease Father    Heart disease Father    Cancer Maternal Grandfather        leukemia   Cancer Paternal Grandmother        breast   Emphysema Other    Cancer Paternal Aunt        breast   Allergies: Allergies  Allergen Reactions   Egg-Derived Products    Other     Clams, hazelnuts., Chemical sensitivities, many fruits, veggies, grass, trees   Medications: See med rec.  Review of Systems: No headache, visual changes, nausea, vomiting, diarrhea, constipation, dizziness, abdominal pain, skin rash, fevers, chills, night sweats, swollen lymph nodes, weight loss, chest pain, body aches, joint swelling, muscle aches, shortness of breath, mood changes, visual or auditory hallucinations.  Objective:    BP 136/82 (BP Location: Left Arm, Patient Position: Sitting, Cuff Size: Normal)   Pulse 82   Ht 5\' 8"  (1.727 m)   Wt 173 lb 12.8 oz (78.8 kg)   SpO2 99%   BMI 26.43 kg/m   General: Well Developed, well nourished, and in no acute distress.  Neuro: Alert and oriented x3, extra-ocular muscles intact, sensation grossly intact. Cranial nerves II through XII are intact, motor, sensory, and coordinative functions are all intact. HEENT: Normocephalic, atraumatic, pupils equal round reactive to light, neck supple, no masses, no lymphadenopathy, thyroid nonpalpable. Oropharynx, nasopharynx, external ear canals are unremarkable. Skin: Warm and dry, no rashes noted.  Cardiac: Regular rate and rhythm. Systolic murmur present  Respiratory: Clear to auscultation bilaterally. Not using accessory muscles, speaking in full sentences.  Abdominal: Soft, nontender, nondistended, positive bowel sounds, no masses, no organomegaly.  Musculoskeletal: Shoulder, elbow, wrist,  hip, knee, ankle stable, and with full range of motion.  Impression and Recommendations:    Wellness examination Assessment & Plan: Routine HCM labs reviewed. HCM reviewed/discussed. Anticipatory guidance regarding healthy weight, lifestyle and choices given. Recommend healthy diet.  Recommend approximately 150 minutes/week of moderate intensity exercise Recommend regular dental and vision exams Always use seatbelt/lap and shoulder restraints Recommend using smoke alarms and checking batteries at least twice a year Recommend using sunscreen when outside Discussed colon cancer screening recommendations, options.  Patient is UTD Discussed recommendations for shingles vaccine.  Patient declines at this time Discussed tetanus immunization recommendations, patient declines Patient  due for mammogram, order placed   Screening mammogram for breast cancer -     3D Screening Mammogram, Left and Right; Future  Hyperlipidemia, unspecified hyperlipidemia type Assessment & Plan: Recent lipid panel again with notable elevation in total cholesterol and LDL. LDL is >190 and there is concern for underlying familial hyperlipidemia - she does report that her father passed away from a "massive heart attack" at 6, but indicates that "he lived a hard life" She has previously been recommended statin therapy but is hesitant in this regard. She does have interest in meeting with lipid clinic through cardiology, referral placed today  Orders: -     AMB Referral to [facility]  Return in about 6 months (around 08/02/2023) for hypertension.   ___________________________________________ Bill Yohn de [place], MD, ABFM, CAQSM Primary [facility]

## 2023-02-01 NOTE — Assessment & Plan Note (Signed)
Routine HCM labs reviewed. HCM reviewed/discussed. Anticipatory guidance regarding healthy weight, lifestyle and choices given. Recommend healthy diet.  Recommend approximately 150 minutes/week of moderate intensity exercise Recommend regular dental and vision exams Always use seatbelt/lap and shoulder restraints Recommend using smoke alarms and checking batteries at least twice a year Recommend using sunscreen when outside Discussed colon cancer screening recommendations, options.  Patient is UTD Discussed recommendations for shingles vaccine.  Patient declines at this time Discussed tetanus immunization recommendations, patient declines Patient due for mammogram, order placed

## 2023-02-01 NOTE — Assessment & Plan Note (Signed)
Recent lipid panel again with notable elevation in total cholesterol and LDL. LDL is >190 and there is concern for underlying familial hyperlipidemia - she does report that her father passed away from a "massive heart attack" at 78, but indicates that "he lived a hard life" She has previously been recommended statin therapy but is hesitant in this regard. She does have interest in meeting with lipid clinic through cardiology, referral placed today

## 2023-02-06 ENCOUNTER — Telehealth (HOSPITAL_BASED_OUTPATIENT_CLINIC_OR_DEPARTMENT_OTHER): Payer: Self-pay | Admitting: Family Medicine

## 2023-02-17 ENCOUNTER — Emergency Department (HOSPITAL_COMMUNITY): Payer: No Typology Code available for payment source

## 2023-02-17 ENCOUNTER — Other Ambulatory Visit: Payer: Self-pay

## 2023-02-17 ENCOUNTER — Encounter (HOSPITAL_COMMUNITY): Payer: Self-pay

## 2023-02-17 ENCOUNTER — Emergency Department (HOSPITAL_COMMUNITY)
Admission: EM | Admit: 2023-02-17 | Discharge: 2023-02-18 | Disposition: A | Discharge status: Home / Self Care | Payer: No Typology Code available for payment source | Attending: Emergency Medicine | Admitting: Emergency Medicine

## 2023-02-17 DIAGNOSIS — R011 Cardiac murmur, unspecified: Secondary | ICD-10-CM | POA: Insufficient documentation

## 2023-02-17 DIAGNOSIS — R9431 Abnormal electrocardiogram [ECG] [EKG]: Secondary | ICD-10-CM | POA: Diagnosis present

## 2023-02-17 DIAGNOSIS — R0600 Dyspnea, unspecified: Secondary | ICD-10-CM | POA: Insufficient documentation

## 2023-02-17 LAB — CBC WITH DIFFERENTIAL/PLATELET
Abs Immature Granulocytes: 0.05 10*3/uL (ref 0.00–0.07)
Basophils Absolute: 0 10*3/uL (ref 0.0–0.1)
Basophils Relative: 0 %
Eosinophils Absolute: 0.1 10*3/uL (ref 0.0–0.5)
Eosinophils Relative: 1 %
HCT: 37 % (ref 36.0–46.0)
Hemoglobin: 12.8 g/dL (ref 12.0–15.0)
Immature Granulocytes: 1 %
Lymphocytes Relative: 16 %
Lymphs Abs: 1.6 10*3/uL (ref 0.7–4.0)
MCH: 32.5 pg (ref 26.0–34.0)
MCHC: 34.6 g/dL (ref 30.0–36.0)
MCV: 93.9 fL (ref 80.0–100.0)
Monocytes Absolute: 0.8 10*3/uL (ref 0.1–1.0)
Monocytes Relative: 8 %
Neutro Abs: 7.8 10*3/uL — ABNORMAL HIGH (ref 1.7–7.7)
Neutrophils Relative %: 74 %
Platelets: 388 10*3/uL (ref 150–400)
RBC: 3.94 MIL/uL (ref 3.87–5.11)
RDW: 13.8 % (ref 11.5–15.5)
WBC: 10.3 10*3/uL (ref 4.0–10.5)
nRBC: 0 % (ref 0.0–0.2)

## 2023-02-17 LAB — COMPREHENSIVE METABOLIC PANEL
ALT: 25 U/L (ref 0–44)
AST: 21 U/L (ref 15–41)
Albumin: 4.2 g/dL (ref 3.5–5.0)
Alkaline Phosphatase: 57 U/L (ref 38–126)
Anion gap: 11 (ref 5–15)
BUN: 16 mg/dL (ref 8–23)
CO2: 20 mmol/L — ABNORMAL LOW (ref 22–32)
Calcium: 9.8 mg/dL (ref 8.9–10.3)
Chloride: 101 mmol/L (ref 98–111)
Creatinine, Ser: 0.67 mg/dL (ref 0.44–1.00)
GFR, Estimated: >60 mL/min (ref >60)
Glucose, Bld: 110 mg/dL — ABNORMAL HIGH (ref 70–99)
Potassium: 4 mmol/L (ref 3.5–5.1)
Sodium: 132 mmol/L — ABNORMAL LOW (ref 135–145)
Total Bilirubin: 0.3 mg/dL (ref <1.2)
Total Protein: 7.1 g/dL (ref 6.5–8.1)

## 2023-02-17 LAB — D-DIMER, QUANTITATIVE: D-Dimer, Quant: 2.06 ug{FEU}/mL — ABNORMAL HIGH (ref 0.00–0.50)

## 2023-02-17 LAB — TROPONIN I (HIGH SENSITIVITY)
Troponin I (High Sensitivity): 8 ng/L (ref <18)
Troponin I (High Sensitivity): 8 ng/L (ref <18)

## 2023-02-17 MED ORDER — IOHEXOL 350 MG/ML SOLN
75.0000 mL | Freq: Once | INTRAVENOUS | Status: AC | PRN
  Administered 2023-02-17: 75 mL via INTRAVENOUS

## 2023-02-17 NOTE — ED Notes (Signed)
Pt returned from [place]. MD at bedside

## 2023-02-17 NOTE — ED Triage Notes (Signed)
Sent from urgent care for abnormal EKG  Pt denies CP Complains of LEFT arm and LEFT shoulder pain started yesterday.  Weak and tired x1 week  SOB on exertion

## 2023-02-17 NOTE — ED Provider Notes (Signed)
[place] EMERGENCY DEPARTMENT AT [facility] Provider Note   CSN: [id number] Arrival date & time: 02/17/23  1907     History  Chief Complaint  Patient presents with   Abnormal ECG    Amber Stone is a 62 y.o. female.  HPI Patient presents with shortness of breath and some pain and tingling in her left arm.  Has had for a week now.  States more likely to happen with exertion.  Does have shortness of breath.  Went to urgent care told to come here for EKG changes.  No frank chest pain.  No known cardiac history.  States she saw her PCP the beginning of this month and was told she had a heart murmur.  States that she is had it before but forgot she had it.    Home Medications Prior to Admission medications   Medication Sig Start Date End Date Taking? Authorizing Provider  albuterol (VENTOLIN HFA) 108 (90 Base) MCG/ACT inhaler Inhale 2 puffs into the lungs every 4 (four) hours as needed.    [provider]  budesonide-formoterol (SYMBICORT) 160-4.5 MCG/ACT inhaler Inhale 2 puffs into the lungs 2 (two) times daily. Patient taking differently: Inhale 2 puffs into the lungs 2 (two) times daily as needed (shortness of breath). 10/17/13   Leslye Peer, MD  cetirizine (ZYRTEC) 10 MG tablet Take 10 mg by mouth daily.    [provider]  Cholecalciferol (VITAMIN D-3) 5000 UNITS TABS Take by mouth daily.     [provider]  clemastine (TAVIST) 2.68 MG TABS tablet Take 2.68 mg by mouth 2 (two) times daily.    [provider]  hydrOXYzine (ATARAX/VISTARIL) 25 MG tablet Take 25 mg by mouth 3 (three) times daily as needed for itching.    [provider]  ipratropium-albuterol (DUONEB) 0.5-2.5 (3) MG/3ML SOLN Inhale 3 mLs into the lungs every 6 (six) hours as needed (shortness of breath wheezing).  02/06/13   [provider]  montelukast (SINGULAIR) 10 MG tablet Take 10 mg by mouth at bedtime.    [provider]   Multiple Vitamin (MULTIVITAMIN) capsule Take 1 capsule by mouth daily.    [provider]  omeprazole (PRILOSEC) 40 MG capsule TAKE ONE CAPSULE BY MOUTH DAILY 11/14/22   de [place], Buren Kos, MD  tiotropium (SPIRIVA) 18 MCG inhalation capsule Place 18 mcg into inhaler and inhale daily.    [provider]      Allergies    Egg-derived products and Other    Review of Systems   Review of Systems  Physical Exam Updated Vital Signs BP (!) 170/76   Pulse 74   Temp 97.8 F (36.6 C) (Oral)   Resp 17   Ht 5\' 7"  (1.702 m)   Wt 77.6 kg   SpO2 96%   BMI 26.78 kg/m  Physical Exam Vitals and nursing note reviewed.  Eyes:     Pupils: Pupils are equal, round, and reactive to light.  Cardiovascular:     Rate and Rhythm: Regular rhythm.     Heart sounds: Murmur heard.     Comments: Systolic murmur. Abdominal:     Tenderness: There is no abdominal tenderness.  Musculoskeletal:        General: No tenderness.  Skin:    General: Skin is warm.  Neurological:     Mental Status: She is alert and oriented to person, place, and time.     ED Results / Procedures / Treatments  Labs (all labs ordered are listed, but only abnormal results are displayed) Labs Reviewed  COMPREHENSIVE METABOLIC PANEL - Abnormal; Notable for the following components:      Result Value   Sodium 132 (*)    CO2 20 (*)    Glucose, Bld 110 (*)    All other components within normal limits  CBC WITH DIFFERENTIAL/PLATELET - Abnormal; Notable for the following components:   Neutro Abs 7.8 (*)    All other components within normal limits  D-DIMER, QUANTITATIVE - Abnormal; Notable for the following components:   D-Dimer, Quant 2.06 (*)    All other components within normal limits  TROPONIN I (HIGH SENSITIVITY)  TROPONIN I (HIGH SENSITIVITY)    EKG EKG Interpretation Date/Time:  Saturday February 17 2023 19:16:02 EST Ventricular Rate:  67 PR Interval:  166 QRS Duration:  102 QT  Interval:  422 QTC Calculation: 445 R Axis:   74  Text Interpretation: Normal sinus rhythm Minimal voltage criteria for LVH, may be normal variant ( Sokolow-Lyon ) Borderline ECG No previous ECGs available Confirmed by Benjiman Core [phone]) on 02/17/2023 7:18:40 PM  Radiology CT Angio Chest PE W and/or Wo Contrast Result Date: 02/17/2023 CLINICAL DATA:  Shortness of breath on exertion with left arm and left shoulder pain. EXAM: CT ANGIOGRAPHY CHEST WITH CONTRAST TECHNIQUE: Multidetector CT imaging of the chest was performed using the standard protocol during bolus administration of intravenous contrast. Multiplanar CT image reconstructions and MIPs were obtained to evaluate the vascular anatomy. RADIATION DOSE REDUCTION: This exam was performed according to the departmental dose-optimization program which includes automated exposure control, adjustment of the mA and/or kV according to patient size and/or use of iterative reconstruction technique. CONTRAST:  75mL OMNIPAQUE IOHEXOL 350 MG/ML SOLN COMPARISON:  March 17, 2014 FINDINGS: Cardiovascular: There is mild to moderate severity calcification of the aortic arch, without evidence of aortic aneurysm. Satisfactory opacification of the pulmonary arteries to the segmental level. No evidence of pulmonary embolism. Normal heart size. No pericardial effusion. Mediastinum/Nodes: No enlarged mediastinal, hilar, or axillary lymph nodes. Thyroid gland, trachea, and esophagus demonstrate no significant findings. Lungs/Pleura: Mild biapical scarring and/or atelectasis is noted, right slightly greater than left. Predominantly stable 3 mm lung nodules are seen within the right middle lobe (axial CT images 79 and 92, CT series 6). No pleural effusion or pneumothorax is identified. Upper Abdomen: Surgical clips are seen within the gallbladder fossa. A 2.6 cm x 1.7 cm x 3.0 cm low-attenuation (approximately 4.25 Hounsfield units) left adrenal mass is noted. A 1.7 cm  diameter exophytic cystic appearing lesion is seen along the lateral aspect of the mid to upper right kidney. Musculoskeletal: A chronic appearing deformity of the sternum is seen. No acute or significant osseous findings. Review of the MIP images confirms the above findings. IMPRESSION: 1. No evidence of pulmonary embolism or other acute intrathoracic process. 2. Predominantly stable, likely benign 3 mm right middle lobe lung nodules. 3. Evidence of prior cholecystectomy. 4. 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 No follow-up imaging is recommended. This recommendation follows ACR consensus guidelines: Management of Incidental Adrenal Masses: A White Paper of the ACR Incidental Findings Committee. J Am Coll Radiol 2017;14:1038-1044. 5. Aortic atherosclerosis. Aortic Atherosclerosis (ICD10-I70.0). Electronically Signed   By: Aram Candela M.D.   On: 02/17/2023 21:38   DG Chest 2 View Result Date: 02/17/2023 CLINICAL DATA:  Chest pain EXAM: CHEST - 2 VIEW COMPARISON:  02/17/2014 FINDINGS: The lungs are clear without focal  pneumonia, edema, pneumothorax or pleural effusion. The cardiopericardial silhouette is within normal limits for size. No acute bony abnormality. IMPRESSION: No active cardiopulmonary disease. Electronically Signed   By: Kennith Center M.D.   On: 02/17/2023 19:44    Procedures Procedures    Medications Ordered in ED Medications  iohexol (OMNIPAQUE) 350 MG/ML injection 75 mL (75 mLs Intravenous Contrast Given 02/17/23 2116)    ED Course/ Medical Decision Making/ A&P                                 Medical Decision Making Amount and/or Complexity of Data Reviewed Labs: ordered. Radiology: ordered. ECG/medicine tests: ordered.  Risk Prescription drug management.   Patient with shortness of breath and tingling in left arm.  Comes with exertion.  Differential diagnosis is long but includes causes such as coronary disease, pulm  embolism.  Also with paresthesias and tingling the arm could be a cervical origin.  EKG does show LVH but no old EKG to compare.  Will get troponin chest x-ray and D-dimer.  Reviewed paperwork from the patient's urgent care and PCP note.  D-dimer elevated.  CT scan done and reassuring.  No PE.  Troponin negative x 2.  Does have heart murmur but doubt cardiac ischemia.  With shortness of breath would benefit from cardiology follow-up which I have put in a ambulatory referral for.  Does have some paresthesias and tingling in her left arm but comes with exertion.  Potentially could be some radiculopathy but I think PCP can follow-up.  Will discharge home.       Final Clinical Impression(s) / ED Diagnoses Final diagnoses:  Dyspnea, unspecified type  Heart murmur    Rx / DC Orders ED Discharge Orders          Ordered    Ambulatory referral to Cardiology       Comments: If you have not heard from the Cardiology office within the next 72 hours please call [phone].   02/17/23 2311              Benjiman Core, MD 02/17/23 2336

## 2023-02-20 ENCOUNTER — Encounter (HOSPITAL_BASED_OUTPATIENT_CLINIC_OR_DEPARTMENT_OTHER): Payer: Self-pay | Admitting: Emergency Medicine

## 2023-02-20 ENCOUNTER — Emergency Department (HOSPITAL_BASED_OUTPATIENT_CLINIC_OR_DEPARTMENT_OTHER): Payer: No Typology Code available for payment source

## 2023-02-20 ENCOUNTER — Emergency Department (HOSPITAL_BASED_OUTPATIENT_CLINIC_OR_DEPARTMENT_OTHER)
Admission: EM | Admit: 2023-02-20 | Discharge: 2023-02-20 | Disposition: A | Discharge status: Home / Self Care | Payer: No Typology Code available for payment source | Attending: Emergency Medicine | Admitting: Emergency Medicine

## 2023-02-20 ENCOUNTER — Other Ambulatory Visit: Payer: Self-pay

## 2023-02-20 DIAGNOSIS — J45909 Unspecified asthma, uncomplicated: Secondary | ICD-10-CM | POA: Insufficient documentation

## 2023-02-20 DIAGNOSIS — L03113 Cellulitis of right upper limb: Secondary | ICD-10-CM | POA: Diagnosis not present

## 2023-02-20 DIAGNOSIS — Z7951 Long term (current) use of inhaled steroids: Secondary | ICD-10-CM | POA: Diagnosis not present

## 2023-02-20 DIAGNOSIS — M79601 Pain in right arm: Secondary | ICD-10-CM | POA: Diagnosis present

## 2023-02-20 MED ORDER — IBUPROFEN 400 MG PO TABS
600.0000 mg | ORAL_TABLET | Freq: Once | ORAL | Status: AC
  Administered 2023-02-20: 600 mg via ORAL
  Filled 2023-02-20: qty 1

## 2023-02-20 NOTE — Discharge Instructions (Signed)
As we discussed, your workup in the ER today was reassuring for acute findings.  Ultrasound imaging of your arm did not reveal any signs of a blood clot or other abnormality.  You are already adequately treated for cellulitis with the clindamycin and it appears that it is working adequately as the redness has receded from the lines that were drawn.  I recommend that you complete this course of antibiotics as prescribed.  I also recommend that you take Tylenol/Motrin as needed for pain.  You may also ice the area for additional symptomatic relief.  Follow-up with your primary doctor as needed.  Return if development of any new or worsening symptoms.

## 2023-02-20 NOTE — ED Provider Notes (Signed)
[facility] AT [facility] Provider Note   CSN: [id number] Arrival date & time: 02/20/23  0840     History  Chief Complaint  Patient presents with   Arm Pain    Amber Stone is a 62 y.o. female.  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  She states that she was seen at [facility] on 12/21 and had a CT scan with contrast.  States that the IV was placed in her right arm.  She does not think that the IV infiltrated during her stay.  The IV was removed at approximately 12 AM on 12/22 and patient noted some bruising in this area and, pain was minimal.  She states that yesterday around 36 hours later she had noticed some redness at the site and some discomfort.  She states that she went to urgent care and she was given Clindamycin for cellulitis and a line was drawn around the area of redness and she was told if she had worsening pain or if the redness grew past the drawn lines that she should come to the emergency department with concern for a blood clot.  She states that the redness has not grown in size but does seem more intense than it did yesterday and her pain is more severe as well.  She denies any history of similar symptoms previously.  Denies any fevers or chills.  The history is provided by the patient. No language interpreter was used.  Arm Pain       Home Medications Prior to Admission medications   Medication Sig Start Date End Date Taking? Authorizing Provider  albuterol (VENTOLIN HFA) 108 (90 Base) MCG/ACT inhaler Inhale 2 puffs into the lungs every 4 (four) hours as needed.    [provider]  budesonide-formoterol (SYMBICORT) 160-4.5 MCG/ACT inhaler Inhale 2 puffs into the lungs 2 (two) times daily. Patient taking differently: Inhale 2 puffs into the lungs 2 (two) times daily as needed (shortness of breath). 10/17/13   Leslye Peer, MD  cetirizine (ZYRTEC) 10 MG tablet Take 10 mg by mouth daily.    [provider]  Cholecalciferol (VITAMIN D-3) 5000 UNITS TABS Take by mouth daily.     [provider]  clemastine (TAVIST) 2.68 MG TABS tablet Take 2.68 mg by mouth 2 (two) times daily.    [provider]  hydrOXYzine (ATARAX/VISTARIL) 25 MG tablet Take 25 mg by mouth 3 (three) times daily as needed for itching.    [provider]  ipratropium-albuterol (DUONEB) 0.5-2.5 (3) MG/3ML SOLN Inhale 3 mLs into the lungs every 6 (six) hours as needed (shortness of breath wheezing).  02/06/13   [provider]  montelukast (SINGULAIR) 10 MG tablet Take 10 mg by mouth at bedtime.    [provider]  Multiple Vitamin (MULTIVITAMIN) capsule Take 1 capsule by mouth daily.    [provider]  omeprazole (PRILOSEC) 40 MG capsule TAKE ONE CAPSULE BY MOUTH DAILY 11/14/22   [place], Buren Kos, MD  tiotropium (SPIRIVA) 18 MCG inhalation capsule Place 18 mcg into inhaler and inhale daily.    [provider]      Allergies    Egg-derived products and Other    Review of Systems   Review of Systems  Skin:  Positive for wound.  All other systems reviewed and are negative.   Physical Exam Updated Vital Signs BP (!) 156/68 (  BP Location: Left Arm)   Pulse 84   Temp 98.1 F (36.7 C) (Oral)   Resp 18   Ht 5\' 7"  (1.702 m)   Wt 76.2 kg   SpO2 98%   BMI 26.31 kg/m  Physical Exam Vitals and nursing note reviewed.  Constitutional:      General: She is not in acute distress.    Appearance: Normal appearance. She is normal weight. She is not ill-appearing, toxic-appearing or diaphoretic.  HENT:     Head: Normocephalic and atraumatic.  Cardiovascular:     Rate and Rhythm: Normal rate.  Pulmonary:     Effort: Pulmonary effort is normal. No respiratory distress.  Musculoskeletal:        General: Normal range of motion.     Cervical back: Normal range of motion.   Skin:    General: Skin is warm and dry.     Comments: Bruising with surrounding erythema noted to the right AC. No fluctuance.  Radial and ulnar pulses intact and 2+.  Compartments soft.  Distal sensation intact.  ROM intact with some discomfort.  Erythema does not traverse the lines drawn at urgent care yesterday. See images below for further.  Neurological:     General: No focal deficit present.     Mental Status: She is alert.  Psychiatric:        Mood and Affect: Mood normal.        Behavior: Behavior normal.     ED Results / Procedures / Treatments   Labs (all labs ordered are listed, but only abnormal results are displayed) Labs Reviewed - No data to display  EKG None  Radiology US Venous Img Upper Right (DVT Study) Result Date: 02/20/2023 CLINICAL DATA:  Right upper extremity pain and swelling EXAM: RIGHT UPPER EXTREMITY VENOUS DOPPLER ULTRASOUND TECHNIQUE: Gray-scale sonography with graded compression, as well as color Doppler and duplex ultrasound were performed to evaluate the upper extremity deep venous system from the level of the subclavian vein and including the jugular, axillary, basilic, radial, ulnar and upper cephalic vein. Spectral Doppler was utilized to evaluate flow at rest and with distal augmentation maneuvers. COMPARISON:  None Available. FINDINGS: Contralateral Subclavian Vein: Respiratory phasicity is normal and symmetric with the symptomatic side. No evidence of thrombus. Normal compressibility. Internal Jugular Vein: No evidence of thrombus. Normal compressibility, respiratory phasicity and response to augmentation. Subclavian Vein: No evidence of thrombus. Normal compressibility, respiratory phasicity and response to augmentation. Axillary Vein: No evidence of thrombus. Normal compressibility, respiratory phasicity and response to augmentation. Cephalic Vein: No evidence of thrombus. Normal compressibility, respiratory phasicity and response to augmentation.  Basilic Vein: No evidence of thrombus. Normal compressibility, respiratory phasicity and response to augmentation. Brachial Veins: No evidence of thrombus. Normal compressibility, respiratory phasicity and response to augmentation. Radial Veins: No evidence of thrombus. Normal compressibility, respiratory phasicity and response to augmentation. Ulnar Veins: No evidence of thrombus. Normal compressibility, respiratory phasicity and response to augmentation. IMPRESSION: No evidence of DVT within the right upper extremity. Electronically Signed   By: Judie Petit.  Shick M.D.   On: 02/20/2023 12:27    Procedures Procedures    Medications Ordered in ED Medications  ibuprofen (ADVIL) tablet 600 mg (600 mg Oral Given 02/20/23 1610)    ED Course/ Medical Decision Making/ A&P                                 Medical Decision Making  This patient is a 62 y.o. female  who presents to the ED for concern of right arm redness/pain.   Differential diagnoses prior to evaluation: The emergent differential diagnosis includes, but is not limited to,  cellulitis, thrombophlebitis, DVT, bruising after IV placement. This is not an exhaustive differential.   Past Medical History / Co-morbidities / Social History:  has a past medical history of Allergic rhinitis, Allergy to chemicals, Arthritis, Asthma, Asthma due to environmental allergies, Chest pain (03/16/2014), Complication of anesthesia, Depression, GERD (gastroesophageal reflux disease), OSA (obstructive sleep apnea), Soft tissue mass, left anterior costal margin (03/26/2013), TONSILLECTOMY, HX OF (12/19/2006), and Vocal cord dysfunction (12/19/2006).  Additional history: Chart reviewed.  Physical Exam: Physical exam performed. The pertinent findings include: see images above, erythema and warmth noted to the right AC with bruising.  Erythema does not traverse lines drawn urgent care yesterday  Lab Tests/Imaging studies: I personally interpreted labs/imaging and  the pertinent results include:  DVT US negative. I agree with the radiologist interpretation.  Medications: I ordered medication including ibuprofen for pain.  I have reviewed the patients home medicines and have made adjustments as needed.   Disposition: After consideration of the diagnostic results and the patients response to treatment, I feel that emergency department workup does not suggest an emergent condition requiring admission or immediate intervention beyond what has been performed at this time. The plan is: discharge with close outpatient follow-up and return precautions. Patients DVT US is negative, she feels better after pain control. Appears that the erythema around the site is improving, given that it is receeding from lines drawn at urgent care yesterday. She is already adequately covered for cellulitis with clindamycin which she has been taking as prescribed.  Recommend ice and NSAIDs as well. Evaluation and diagnostic testing in the emergency department does not suggest an emergent condition requiring admission or immediate intervention beyond what has been performed at this time.  Plan for discharge with close PCP follow-up.  Patient is understanding and amenable with plan, educated on red flag symptoms that would prompt immediate return.  Patient discharged in stable condition.  Findings and plan of care discussed with supervising physician Dr. Karene Fry who is in agreement.   Final Clinical Impression(s) / ED Diagnoses Final diagnoses:  Cellulitis of right upper extremity    Rx / DC Orders ED Discharge Orders     None     An After Visit Summary was printed and given to the patient.     Vear Clock 02/20/23 1313    Ernie Avena, MD 02/21/23 1010

## 2023-02-20 NOTE — ED Triage Notes (Signed)
C/o R arm redness and swelling. States she was seen at [facility] ED on Saturday for CP workup and got a CT w/ contrast in that arm. Patient reports the redness has increased since visit.

## 2023-02-23 ENCOUNTER — Encounter (HOSPITAL_BASED_OUTPATIENT_CLINIC_OR_DEPARTMENT_OTHER): Payer: Self-pay | Admitting: Family Medicine

## 2023-02-23 ENCOUNTER — Ambulatory Visit (HOSPITAL_BASED_OUTPATIENT_CLINIC_OR_DEPARTMENT_OTHER): Payer: No Typology Code available for payment source | Admitting: Family Medicine

## 2023-02-23 VITALS — BP 130/78 | HR 68 | Ht 67.0 in | Wt 170.5 lb

## 2023-02-23 DIAGNOSIS — R42 Dizziness and giddiness: Secondary | ICD-10-CM

## 2023-02-23 DIAGNOSIS — R5383 Other fatigue: Secondary | ICD-10-CM | POA: Diagnosis not present

## 2023-02-23 DIAGNOSIS — L03113 Cellulitis of right upper limb: Secondary | ICD-10-CM

## 2023-02-23 DIAGNOSIS — R011 Cardiac murmur, unspecified: Secondary | ICD-10-CM

## 2023-02-23 DIAGNOSIS — R0989 Other specified symptoms and signs involving the circulatory and respiratory systems: Secondary | ICD-10-CM

## 2023-02-23 NOTE — Patient Instructions (Addendum)
Arnica Cream for bruising

## 2023-02-23 NOTE — Progress Notes (Signed)
Subjective:   Amber Stone 03/11/1960 02/23/2023  Chief Complaint  Patient presents with   ed visit    Patient has been to urgent care and to the ED multiple times as she was having tingling in her hands. States that EKGs were performed and a heart attack was ruled out. Patient said that they told her that her heart was enlarged. Does not have a cardiologist but had a referral placed by the ED.    HPI:  Patient presented to the ER on 02/17/2023 for shortness of breath and pain and tingling in her left arm ongoing for 1 week.  She reported being told she had a murmur on her prior wellness exam on 02/01/2023 by her PCP.  She did have a full cardiac workup while at the ER, and was noted to have elevated D-dimer, mild hyponatremia, mild neutrophil elevation.  Her EKG was normal sinus rhythm with minimal voltage criteria for LVH, possible normal variant .  She did have a CTA due to elevated D-dimer which was unremarkable for PE or cardiopulmonary disease.  Troponins were unremarkable.  Systolic murmur was present upon exam, and patient was referred to cardiology to establish care.    Patient ended up going to urgent care on 02/19/2023 for cellulitis of the right upper extremity due to extravasation of IV and was treated with antibiotics.  She also ended up returning to the ER on 02/20/2023 due to cellulitis of right upper extremity from extravasation of IV.  She had a history of subclavian vein thrombosis to the left arm previously.  She had a workup for DVT with ultrasound of the right upper extremity which was negative.  She was continued on clindamycin for cellulitis and reports that this has almost resolved and has had significant improvement in her pain, swelling and redness.  She has a few more days of clindamycin to finish.  Patient has noticed over the past week that she has had improved difficulty with exertional activities such as walking long distances, walking up the stairs and  has noticed some difficulty with increased fatigue.  She has also noticed mild edema to bilateral lower extremities.  She also states she has noticed some mild dizziness with positional changes.  Denies syncope or near syncope.  Denies chest pain, palpitations, vision changes.    The following portions of the patient's history were reviewed and updated as appropriate: past medical history, past surgical history, family history, social history, allergies, medications, and problem list.   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   Subclavian vein thrombosis, left (HCC) 03/26/2012   Breast swelling 03/19/2012   OBSTRUCTIVE SLEEP APNEA 12/19/2006   Asthma 12/19/2006   GERD 12/19/2006   Past Medical History:  Diagnosis Date   Allergic rhinitis    Allergy to chemicals    Arthritis    hands   Asthma    Asthma due to environmental allergies    Chest pain 03/16/2014   Complication of anesthesia  hard to wake up   Depression    GERD (gastroesophageal reflux disease)    OSA (obstructive sleep apnea)    lost weight, not used CPAP in over 5 yrs   Soft tissue mass, left anterior costal margin 03/26/2013   TONSILLECTOMY, HX OF 12/19/2006   Annotation: 1978 Qualifier: Diagnosis of  By: Laural Benes RN, Erika     Vocal cord dysfunction 12/19/2006   Qualifier: Diagnosis of  By: Coralee North     Past Surgical History:  Procedure Laterality Date    BREAST BIOPSY Right 05/22/2019   benign   BREAST LUMPECTOMY WITH RADIOACTIVE SEED LOCALIZATION Right 04/24/2019   Procedure: RIGHT BREAST LUMPECTOMY WITH RADIOACTIVE SEED X 2;  Surgeon: Abigail Miyamoto, MD;  Location: [facility];  Service: General;  Laterality: Right;   BREAST SURGERY     bx right side   CHOLECYSTECTOMY     COLONOSCOPY N/A 12/30/2014   Procedure: COLONOSCOPY;  Surgeon: Malissa Hippo, MD;  Location: [facility];  Service: Endoscopy;  Laterality: N/A;  730   GANGLION CYST EXCISION     left hand   LASIK  02/28/1995   LIPOMA EXCISION     lower back   NASAL SINUS SURGERY     tonsillectomy  02/28/1976   Family History  Problem Relation Age of Onset   Coronary artery disease Mother    Rheum arthritis Mother    Coronary artery disease Father    Heart disease Father    Cancer Maternal Grandfather        leukemia   Cancer Paternal Grandmother        breast   Emphysema Other    Cancer Paternal Aunt        breast   Outpatient Medications Prior to Visit  Medication Sig Dispense Refill   albuterol (VENTOLIN HFA) 108 (90 Base) MCG/ACT inhaler Inhale 2 puffs into the lungs every 4 (four) hours as needed.     budesonide-formoterol (SYMBICORT) 160-4.5 MCG/ACT inhaler Inhale 2 puffs into the lungs 2 (two) times daily. (Patient taking differently: Inhale 2 puffs into the lungs 2 (two) times daily as needed (shortness of breath).) 3 Inhaler 3   cetirizine (ZYRTEC) 10 MG tablet Take 10 mg by mouth daily.     Cholecalciferol (VITAMIN D-3) 5000 UNITS TABS Take by mouth daily.      clemastine (TAVIST) 2.68 MG TABS tablet Take 2.68 mg by mouth 2 (two) times daily.     clindamycin (CLEOCIN) 300 MG capsule Take 300 mg by mouth 3 (three) times daily.     hydrOXYzine (ATARAX/VISTARIL) 25 MG tablet Take 25 mg by mouth 3 (three) times daily as needed for itching.     ipratropium-albuterol (DUONEB) 0.5-2.5 (3) MG/3ML SOLN Inhale 3 mLs into the lungs every 6 (six) hours as  needed (shortness of breath wheezing).      montelukast (SINGULAIR) 10 MG tablet Take 10 mg by mouth at bedtime.     Multiple Vitamin (MULTIVITAMIN) capsule Take 1 capsule by mouth daily.     omeprazole (PRILOSEC) 40 MG capsule TAKE ONE CAPSULE BY MOUTH DAILY 90 capsule 1   tiotropium (SPIRIVA) 18 MCG inhalation capsule Place 18 mcg into inhaler and inhale daily.     No facility-administered medications prior to visit.   Allergies  Allergen Reactions   Egg-Derived Products    Other     Clams, hazelnuts., Chemical sensitivities, many fruits, veggies, grass, trees     ROS: A complete ROS was performed  with pertinent positives/negatives noted in the HPI. The remainder of the ROS are negative.    Objective:   Today's Vitals   02/23/23 0830 02/23/23 0855  BP: (!) 148/73 130/78  Pulse: 68   SpO2: 98%   Weight: 170 lb 8 oz (77.3 kg)   Height: 5\' 7"  (1.702 m)     Physical Exam          GENERAL: Well-appearing, in NAD. Well nourished.  SKIN: Pink, warm and dry.  Ecchymosis present to bilateral antecubital areas of right and left upper extremities.  No redness, edema, or signs of infection present. Head: Normocephalic. NECK: Trachea midline. Full ROM w/o pain or tenderness. No lymphadenopathy.  RESPIRATORY: Chest wall symmetrical. Respirations even and non-labored. Breath sounds clear to auscultation bilaterally.  CARDIAC: S1, S2 present, regular rate and rhythm with Grade II systolic murmur.  No gallops.  Carotid artery with bruit present to right carotid.  No thrills palpated.  No bruit to left carotid artery . peripheral pulses 2+ bilaterally.  MSK: Muscle tone and strength appropriate for age.  EXTREMITIES: Without clubbing, cyanosis.  Very mild nonpitting +1 edema to bilateral lower extremities NEUROLOGIC: No motor or sensory deficits. Steady, even gait. C2-C12 intact.  PSYCH/MENTAL STATUS: Alert, oriented x 3. Cooperative, appropriate mood and affect.      Assessment & Plan:   1. Systolic murmur (Primary) 2. Bruit of right carotid artery Will obtain echocardiogram due to new presence of systolic murmur and also obtain ultrasound bilateral carotids due to murmur and also bruit of right carotid artery.  Will also obtain a BNP to evaluate for signs and symptoms of heart failure.  Patient does have a cardiology appointment scheduled for February 2025.  Discussed red flag signs and symptoms to call PCP and/or go to ER for and patient verbalized understanding.  Recommend monitoring salt intake, fluid intake, and resting.  - ECHOCARDIOGRAM COMPLETE; Future - Brain natriuretic peptide - VAS US CAROTID; Future  3. Dizziness 4. Other fatigue Will obtain ultrasound carotid, and the following lab work to rule out possible causes of dizziness and fatigue.  Chart reviewed and other labs recently done with ER visits were reviewed by PCP.  Mild hyponatremia present which is chronic per patient as she does not use salt additives or processed foods. - VAS US CAROTID; Future - Basic Metabolic Panel (BMET) - Brain natriuretic peptide  5. Cellulitis of right upper extremity Improving.  Patient to continue clindamycin as directed and return to PCP if redness, erythema, drainage, or pain returns.   Lab Orders         Brain natriuretic peptide         Basic Metabolic Panel (BMET)      Return in about 3 months (around 05/24/2023) for Follow up Systolic Murmur, BP.    Patient to reach out to office if new, worrisome, or unresolved symptoms arise or if no improvement in patient's condition. Patient verbalized understanding and is agreeable to treatment plan. All questions answered to patient's satisfaction.    Hilbert Bible, [place]

## 2023-02-24 LAB — BASIC METABOLIC PANEL
BUN/Creatinine Ratio: 15 (ref 12–28)
BUN: 13 mg/dL (ref 8–27)
CO2: 23 mmol/L (ref 20–29)
Calcium: 10.1 mg/dL (ref 8.7–10.3)
Chloride: 98 mmol/L (ref 96–106)
Creatinine, Ser: 0.84 mg/dL (ref 0.57–1.00)
Glucose: 95 mg/dL (ref 70–99)
Potassium: 4.9 mmol/L (ref 3.5–5.2)
Sodium: 135 mmol/L (ref 134–144)
eGFR: 78 mL/min/{1.73_m2} (ref >59)

## 2023-02-24 LAB — BRAIN NATRIURETIC PEPTIDE: BNP: 10.2 pg/mL (ref 0.0–100.0)

## 2023-02-25 ENCOUNTER — Encounter (HOSPITAL_BASED_OUTPATIENT_CLINIC_OR_DEPARTMENT_OTHER): Payer: Self-pay | Admitting: Emergency Medicine

## 2023-02-25 ENCOUNTER — Other Ambulatory Visit: Payer: Self-pay

## 2023-02-25 ENCOUNTER — Observation Stay (HOSPITAL_BASED_OUTPATIENT_CLINIC_OR_DEPARTMENT_OTHER)
Admission: EM | Admit: 2023-02-25 | Discharge: 2023-02-28 | Disposition: A | Discharge status: Home Health | Payer: No Typology Code available for payment source | Attending: Family Medicine | Admitting: Family Medicine

## 2023-02-25 ENCOUNTER — Emergency Department (HOSPITAL_BASED_OUTPATIENT_CLINIC_OR_DEPARTMENT_OTHER): Payer: No Typology Code available for payment source

## 2023-02-25 DIAGNOSIS — I200 Unstable angina: Secondary | ICD-10-CM | POA: Insufficient documentation

## 2023-02-25 DIAGNOSIS — E785 Hyperlipidemia, unspecified: Secondary | ICD-10-CM | POA: Insufficient documentation

## 2023-02-25 DIAGNOSIS — R42 Dizziness and giddiness: Secondary | ICD-10-CM | POA: Diagnosis not present

## 2023-02-25 DIAGNOSIS — J45909 Unspecified asthma, uncomplicated: Secondary | ICD-10-CM | POA: Diagnosis present

## 2023-02-25 DIAGNOSIS — R011 Cardiac murmur, unspecified: Secondary | ICD-10-CM | POA: Insufficient documentation

## 2023-02-25 DIAGNOSIS — Z79899 Other long term (current) drug therapy: Secondary | ICD-10-CM | POA: Insufficient documentation

## 2023-02-25 DIAGNOSIS — J449 Chronic obstructive pulmonary disease, unspecified: Secondary | ICD-10-CM | POA: Diagnosis not present

## 2023-02-25 DIAGNOSIS — R55 Syncope and collapse: Secondary | ICD-10-CM | POA: Insufficient documentation

## 2023-02-25 DIAGNOSIS — R079 Chest pain, unspecified: Principal | ICD-10-CM | POA: Insufficient documentation

## 2023-02-25 LAB — BASIC METABOLIC PANEL
Anion gap: 8 (ref 5–15)
BUN: 18 mg/dL (ref 8–23)
CO2: 23 mmol/L (ref 22–32)
Calcium: 9.2 mg/dL (ref 8.9–10.3)
Chloride: 100 mmol/L (ref 98–111)
Creatinine, Ser: 0.78 mg/dL (ref 0.44–1.00)
GFR, Estimated: >60 mL/min (ref >60)
Glucose, Bld: 111 mg/dL — ABNORMAL HIGH (ref 70–99)
Potassium: 4.3 mmol/L (ref 3.5–5.1)
Sodium: 131 mmol/L — ABNORMAL LOW (ref 135–145)

## 2023-02-25 LAB — CBC
HCT: 36.1 % (ref 36.0–46.0)
Hemoglobin: 12.7 g/dL (ref 12.0–15.0)
MCH: 32.2 pg (ref 26.0–34.0)
MCHC: 35.2 g/dL (ref 30.0–36.0)
MCV: 91.6 fL (ref 80.0–100.0)
Platelets: 403 10*3/uL — ABNORMAL HIGH (ref 150–400)
RBC: 3.94 MIL/uL (ref 3.87–5.11)
RDW: 13.6 % (ref 11.5–15.5)
WBC: 9 10*3/uL (ref 4.0–10.5)
nRBC: 0 % (ref 0.0–0.2)

## 2023-02-25 LAB — TROPONIN I (HIGH SENSITIVITY): Troponin I (High Sensitivity): 7 ng/L (ref <18)

## 2023-02-25 MED ORDER — ACETAMINOPHEN 500 MG PO TABS
1000.0000 mg | ORAL_TABLET | Freq: Once | ORAL | Status: AC
Start: 2023-02-26 — End: 2023-02-26
  Administered 2023-02-26: 1000 mg via ORAL
  Filled 2023-02-25: qty 2

## 2023-02-25 NOTE — ED Provider Notes (Signed)
[place] EMERGENCY DEPARTMENT AT [facility] Provider Note   CSN: [id number] Arrival date & time: 02/25/23  2143     History  Chief Complaint  Patient presents with   Chest Pain    Amber Stone is a 62 y.o. female.  62 year old female with past medical history of asthma presenting to the emergency department today with concern for intermittent chest pain and lightheadedness.  The patient states that this has been going now for the past week or so.  She was evaluated in [facility] emergency department and had a favorable ischemic workup.  The patient was noted to have a new heart murmur at that time.  She states that she notes since she is gotten home that she has been having worsening lightheadedness and some intermittent chest pain.  She reports is more of a pressure sensation.  She does report some dyspnea with this.  She states that she was not able to get an appointment with cardiology until February.  She came to the ER today due to worsening symptoms.  She states that it had gotten to the point tonight that she had to buy a wheelchair because she felt passed out.   Chest Pain      Home Medications Prior to Admission medications   Medication Sig Start Date End Date Taking? Authorizing Provider  albuterol (VENTOLIN HFA) 108 (90 Base) MCG/ACT inhaler Inhale 2 puffs into the lungs every 4 (four) hours as needed.    [provider]  budesonide-formoterol (SYMBICORT) 160-4.5 MCG/ACT inhaler Inhale 2 puffs into the lungs 2 (two) times daily. Patient taking differently: Inhale 2 puffs into the lungs 2 (two) times daily as needed (shortness of breath). 10/17/13   Leslye Peer, MD  cetirizine (ZYRTEC) 10 MG tablet Take 10 mg by mouth daily.    [provider]  Cholecalciferol (VITAMIN D-3) 5000 UNITS TABS Take by mouth daily.     [provider]  clemastine (TAVIST) 2.68 MG TABS tablet Take 2.68 mg by mouth 2 (two) times daily.    [provider]  clindamycin (CLEOCIN) 300 MG capsule Take 300 mg by mouth 3 (three) times daily. 02/19/23   [provider]  hydrOXYzine (ATARAX/VISTARIL) 25 MG tablet Take 25 mg by mouth 3 (three) times daily as needed for itching.    [provider]  ipratropium-albuterol (DUONEB) 0.5-2.5 (3) MG/3ML SOLN Inhale 3 mLs into the lungs every 6 (six) hours as needed (shortness of breath wheezing).  02/06/13   [provider]  montelukast (SINGULAIR) 10 MG tablet Take 10 mg by mouth at bedtime.    [provider]  Multiple Vitamin (MULTIVITAMIN) capsule Take 1 capsule by mouth daily.    [provider]  omeprazole (PRILOSEC) 40 MG capsule TAKE ONE CAPSULE BY MOUTH DAILY 11/14/22   de [place], Buren Kos, MD  tiotropium (SPIRIVA) 18 MCG inhalation capsule Place 18 mcg into inhaler and inhale daily.    [provider]      Allergies    Egg-derived products and Other    Review of Systems   Review of Systems  Cardiovascular:  Positive for chest pain.  Neurological:  Positive for light-headedness.  All other systems reviewed and are negative.   Physical Exam Updated Vital Signs BP (!) 164/82 (BP Location: Right Arm)   Pulse 69   Temp 98.1 F (36.7 C)   Resp 18   Ht 5\' 7"  (1.702 m)   Wt 77.1 kg   SpO2  100%   BMI 26.63 kg/m  Physical Exam Vitals and nursing note reviewed.   Gen: NAD Eyes: PERRL, EOMI HEENT: no oropharyngeal swelling Neck: trachea midline Resp: clear to auscultation bilaterally Card: There is a loud systolic murmur loudest over the left lower sternal border Abd: nontender, nondistended Extremities: no calf tenderness, no edema Vascular: 2+ radial pulses bilaterally, 2+ DP pulses bilaterally Skin: no rashes Psyc: acting appropriately   ED Results / Procedures / Treatments   Labs (all labs ordered are listed, but only abnormal results are displayed) Labs Reviewed  BASIC METABOLIC PANEL - Abnormal; Notable for the  following components:      Result Value   Sodium 131 (*)    Glucose, Bld 111 (*)    All other components within normal limits  CBC - Abnormal; Notable for the following components:   Platelets 403 (*)    All other components within normal limits  TROPONIN I (HIGH SENSITIVITY)    EKG None  Radiology DG Chest Port 1 View Result Date: 02/25/2023 CLINICAL DATA:  Left-sided chest pain for several hours, initial encounter EXAM: PORTABLE CHEST 1 VIEW COMPARISON:  02/17/2023 FINDINGS: Cardiac shadow is within normal limits. Aortic calcifications are noted. Lungs are well aerated bilaterally. No bony abnormality is noted. IMPRESSION: No active disease. Electronically Signed   By: Alcide Clever M.D.   On: 02/25/2023 22:44    Procedures Procedures    Medications Ordered in ED Medications - No data to display  ED Course/ Medical Decision Making/ A&P                                 Medical Decision Making 62 year old female with past medical history of asthma presenting to the emergency department today with chest pain and lightheadedness in the setting of new murmur.  This is very obvious here on exam.  I will further evaluate the patient here with basic labs well as an EKG, chest x-ray, and troponin for further evaluation for ACS, pulmonary edema, pulmonary infiltrates, or pneumothorax.  Given her worsening symptoms with this new murmur she will likely require admission for echocardiogram and further syncope workup.  The patient's EKG interpreted by me shows sinus rhythm with normal axis, normal intervals, no significant ST-T changes.  Labs are reassuring.  Initial troponin is negative.  Called placed hospital service for admission.  Amount and/or Complexity of Data Reviewed Labs: ordered. Radiology: ordered.           Final Clinical Impression(s) / ED Diagnoses Final diagnoses:  Postural dizziness with presyncope  Heart murmur  Chest pain, unspecified type    Rx / DC  Orders ED Discharge Orders     None         Durwin Glaze, MD 02/25/23 2256

## 2023-02-25 NOTE — ED Triage Notes (Signed)
Pt presents to the ED today for left sided chest pain that started about 1500 today. Pt reports left arm numbness and generalized weakness x1 week. Pt states she had "enzymes" drawn last week for her arm numbness and weakness. Pt reports weakness is worse today. Pt reports strong family history of cardiac problems, mom and dad both had Mis. Pt reports her heart is enlarged and has a valve issue, also history of a murmur.

## 2023-02-25 NOTE — ED Provider Notes (Signed)
  Provider Note MRN:  [id number]  Arrival date & time: 02/25/23    ED Course and Medical Decision Making  Assumed care from Dr. Rhae Hammock at shift change.  Chest pain, lightheadedness, near syncopal episode, loud murmur, plan is for admission.  Procedures  Final Clinical Impressions(s) / ED Diagnoses     ICD-10-CM   1. Postural dizziness with presyncope  R42    R55     2. Heart murmur  R01.1     3. Chest pain, unspecified type  R07.9       ED Discharge Orders     None       Discharge Instructions   None     Elmer Sow. Pilar Plate, MD [facility] [email or web address]    Sabas Sous, MD 02/26/23 [phone]

## 2023-02-26 ENCOUNTER — Encounter (HOSPITAL_BASED_OUTPATIENT_CLINIC_OR_DEPARTMENT_OTHER): Payer: Self-pay

## 2023-02-26 ENCOUNTER — Other Ambulatory Visit (HOSPITAL_BASED_OUTPATIENT_CLINIC_OR_DEPARTMENT_OTHER): Payer: Self-pay | Admitting: Family Medicine

## 2023-02-26 ENCOUNTER — Observation Stay (HOSPITAL_BASED_OUTPATIENT_CLINIC_OR_DEPARTMENT_OTHER): Payer: No Typology Code available for payment source

## 2023-02-26 DIAGNOSIS — R079 Chest pain, unspecified: Secondary | ICD-10-CM | POA: Diagnosis not present

## 2023-02-26 DIAGNOSIS — J449 Chronic obstructive pulmonary disease, unspecified: Secondary | ICD-10-CM | POA: Diagnosis not present

## 2023-02-26 DIAGNOSIS — R55 Syncope and collapse: Secondary | ICD-10-CM | POA: Diagnosis not present

## 2023-02-26 DIAGNOSIS — Z79899 Other long term (current) drug therapy: Secondary | ICD-10-CM | POA: Diagnosis not present

## 2023-02-26 DIAGNOSIS — I200 Unstable angina: Secondary | ICD-10-CM | POA: Diagnosis not present

## 2023-02-26 DIAGNOSIS — R011 Cardiac murmur, unspecified: Secondary | ICD-10-CM

## 2023-02-26 DIAGNOSIS — R0989 Other specified symptoms and signs involving the circulatory and respiratory systems: Secondary | ICD-10-CM

## 2023-02-26 DIAGNOSIS — R42 Dizziness and giddiness: Secondary | ICD-10-CM | POA: Diagnosis not present

## 2023-02-26 DIAGNOSIS — E785 Hyperlipidemia, unspecified: Secondary | ICD-10-CM | POA: Diagnosis not present

## 2023-02-26 LAB — ECHOCARDIOGRAM COMPLETE
AR max vel: 1.29 cm2
AV Area VTI: 1.19 cm2
AV Area mean vel: 1.25 cm2
AV Mean grad: 13.5 mm[Hg]
AV Peak grad: 26 mm[Hg]
Ao pk vel: 2.55 m/s
Area-P 1/2: 3.72 cm2
Height: 67 in
MV M vel: 2.26 m/s
MV Peak grad: 20.4 mm[Hg]
P 1/2 time: 468 ms
S' Lateral: 2.8 cm
Weight: 2670.21 [oz_av]

## 2023-02-26 LAB — CBC
HCT: 37.9 % (ref 36.0–46.0)
Hemoglobin: 13.2 g/dL (ref 12.0–15.0)
MCH: 32.2 pg (ref 26.0–34.0)
MCHC: 34.8 g/dL (ref 30.0–36.0)
MCV: 92.4 fL (ref 80.0–100.0)
Platelets: 458 10*3/uL — ABNORMAL HIGH (ref 150–400)
RBC: 4.1 MIL/uL (ref 3.87–5.11)
RDW: 13.8 % (ref 11.5–15.5)
WBC: 8.7 10*3/uL (ref 4.0–10.5)
nRBC: 0 % (ref 0.0–0.2)

## 2023-02-26 LAB — CREATININE, SERUM
Creatinine, Ser: 0.73 mg/dL (ref 0.44–1.00)
GFR, Estimated: >60 mL/min (ref >60)

## 2023-02-26 LAB — D-DIMER, QUANTITATIVE: D-Dimer, Quant: 2.05 ug{FEU}/mL — ABNORMAL HIGH (ref 0.00–0.50)

## 2023-02-26 LAB — HIV ANTIBODY (ROUTINE TESTING W REFLEX): HIV Screen 4th Generation wRfx: NONREACTIVE

## 2023-02-26 LAB — TROPONIN I (HIGH SENSITIVITY): Troponin I (High Sensitivity): 7 ng/L (ref <18)

## 2023-02-26 LAB — BRAIN NATRIURETIC PEPTIDE: B Natriuretic Peptide: 76.3 pg/mL (ref 0.0–100.0)

## 2023-02-26 LAB — TSH: TSH: 0.807 u[IU]/mL (ref 0.350–4.500)

## 2023-02-26 MED ORDER — POLYETHYLENE GLYCOL 3350 17 G PO PACK: 17.0000 g | PACK | Freq: Every day | ORAL | Status: DC | PRN

## 2023-02-26 MED ORDER — HYDROXYZINE HCL 25 MG PO TABS
25.0000 mg | ORAL_TABLET | Freq: Three times a day (TID) | ORAL | Status: DC | PRN
  Administered 2023-02-26 – 2023-02-27 (×2): 25 mg via ORAL
  Filled 2023-02-26 (×2): qty 1

## 2023-02-26 MED ORDER — TIOTROPIUM BROMIDE MONOHYDRATE 18 MCG IN CAPS: 18.0000 ug | ORAL_CAPSULE | Freq: Every day | RESPIRATORY_TRACT | Status: DC

## 2023-02-26 MED ORDER — PANTOPRAZOLE SODIUM 40 MG PO TBEC
40.0000 mg | DELAYED_RELEASE_TABLET | Freq: Every day | ORAL | Status: DC
  Administered 2023-02-26 – 2023-02-28 (×2): 40 mg via ORAL
  Filled 2023-02-26 (×2): qty 1

## 2023-02-26 MED ORDER — MOMETASONE FURO-FORMOTEROL FUM 200-5 MCG/ACT IN AERO
2.0000 | INHALATION_SPRAY | Freq: Two times a day (BID) | RESPIRATORY_TRACT | Status: DC
  Administered 2023-02-26 – 2023-02-28 (×4): 2 via RESPIRATORY_TRACT
  Filled 2023-02-26: qty 8.8

## 2023-02-26 MED ORDER — MONTELUKAST SODIUM 10 MG PO TABS
10.0000 mg | ORAL_TABLET | Freq: Every day | ORAL | Status: DC
Start: 2023-02-27 — End: 2023-02-28
  Administered 2023-02-27: 10 mg via ORAL
  Filled 2023-02-26: qty 1

## 2023-02-26 MED ORDER — LORATADINE 10 MG PO TABS
10.0000 mg | ORAL_TABLET | Freq: Every day | ORAL | Status: DC
  Administered 2023-02-28: 10 mg via ORAL
  Filled 2023-02-26: qty 1

## 2023-02-26 MED ORDER — ACETAMINOPHEN 500 MG PO TABS
1000.0000 mg | ORAL_TABLET | Freq: Once | ORAL | Status: AC
  Administered 2023-02-26: 1000 mg via ORAL
  Filled 2023-02-26: qty 2

## 2023-02-26 MED ORDER — ALBUTEROL SULFATE (2.5 MG/3ML) 0.083% IN NEBU: 2.5000 mg | INHALATION_SOLUTION | RESPIRATORY_TRACT | Status: DC | PRN

## 2023-02-26 MED ORDER — OXYCODONE HCL 5 MG PO TABS: 5.0000 mg | ORAL_TABLET | ORAL | Status: DC | PRN

## 2023-02-26 MED ORDER — ACETAMINOPHEN 325 MG PO TABS
650.0000 mg | ORAL_TABLET | Freq: Four times a day (QID) | ORAL | Status: DC | PRN
  Administered 2023-02-26 – 2023-02-27 (×2): 650 mg via ORAL
  Filled 2023-02-26 (×2): qty 2

## 2023-02-26 MED ORDER — ALBUTEROL SULFATE HFA 108 (90 BASE) MCG/ACT IN AERS: 2.0000 | INHALATION_SPRAY | RESPIRATORY_TRACT | Status: DC | PRN

## 2023-02-26 MED ORDER — FAMOTIDINE 20 MG PO TABS
40.0000 mg | ORAL_TABLET | Freq: Two times a day (BID) | ORAL | Status: DC
  Administered 2023-02-26 – 2023-02-28 (×5): 40 mg via ORAL
  Filled 2023-02-26 (×5): qty 2

## 2023-02-26 MED ORDER — UMECLIDINIUM BROMIDE 62.5 MCG/ACT IN AEPB
1.0000 | INHALATION_SPRAY | Freq: Every day | RESPIRATORY_TRACT | Status: DC
  Filled 2023-02-26: qty 7

## 2023-02-26 MED ORDER — ENOXAPARIN SODIUM 40 MG/0.4ML IJ SOSY
40.0000 mg | PREFILLED_SYRINGE | INTRAMUSCULAR | Status: DC
  Administered 2023-02-26 – 2023-02-27 (×2): 40 mg via SUBCUTANEOUS
  Filled 2023-02-26 (×2): qty 0.4

## 2023-02-26 MED ORDER — ACETAMINOPHEN 650 MG RE SUPP: 650.0000 mg | Freq: Four times a day (QID) | RECTAL | Status: DC | PRN

## 2023-02-26 NOTE — Plan of Care (Signed)
  Problem: Education: Goal: Knowledge of General Education information will improve Description: Including pain rating scale, medication(s)/side effects and non-pharmacologic comfort measures Outcome: Progressing   Problem: Health Behavior/Discharge Planning: Goal: Ability to manage health-related needs will improve Outcome: Progressing   Problem: Clinical Measurements: Goal: Ability to maintain clinical measurements within normal limits will improve Outcome: Progressing Goal: Will remain free from infection Outcome: Progressing Goal: Diagnostic test results will improve Outcome: Progressing Goal: Cardiovascular complication will be avoided Outcome: Progressing   Problem: Activity: Goal: Risk for activity intolerance will decrease Outcome: Progressing   Problem: Nutrition: Goal: Adequate nutrition will be maintained Outcome: Progressing   Problem: Coping: Goal: Level of anxiety will decrease Outcome: Progressing   Problem: Elimination: Goal: Will not experience complications related to bowel motility Outcome: Progressing   Problem: Pain Management: Goal: General experience of comfort will improve Outcome: Progressing   Problem: Safety: Goal: Ability to remain free from injury will improve Outcome: Progressing   Problem: Skin Integrity: Goal: Risk for impaired skin integrity will decrease Outcome: Progressing   Problem: Education: Goal: Understanding of cardiac disease, CV risk reduction, and recovery process will improve Outcome: Progressing Goal: Individualized Educational Video(s) Outcome: Progressing   Problem: Activity: Goal: Ability to tolerate increased activity will improve Outcome: Progressing   Problem: Cardiac: Goal: Ability to achieve and maintain adequate cardiovascular perfusion will improve Outcome: Progressing   Problem: Health Behavior/Discharge Planning: Goal: Ability to safely manage health-related needs after discharge will  improve Outcome: Progressing

## 2023-02-26 NOTE — Progress Notes (Signed)
Echocardiogram 2D Echocardiogram has been performed.  Amber Stone 02/26/2023, 2:34 PM

## 2023-02-26 NOTE — ED Notes (Signed)
 Pt ambulates independently to the bathroom with steady gait.

## 2023-02-26 NOTE — Evaluation (Signed)
Occupational Therapy Evaluation Patient Details Name: Amber Stone MRN: [id number] DOB: 05-14-1960 Today's Date: 02/26/2023   History of Present Illness 62 y.o. female with medical history significant of asthma, allergies, depression, GERD, and multiple other medical issues here with progressive malaise, SOB with exertion, weakness, and chest pain.   Clinical Impression   Patient admitted for the diagnosis above.  PTA she lives at home with her spouse, whom she cares for due to dementia.  Patient at baseline is active, independent with mobility, driving, ADL and iADL.  Patient continues to do yard work.  Primary deficits includes SOB, slowed gait compared to baseline, and continued complaint of chest pain.  Patient able to walk 150' feet in the hall without an AD, took two rest breaks and HR was 88 upon entering the room.  HR was low 60's prior to the walk.  Patient is needing generalized supervision for all mobility and self care tasks.  OT will continue efforts in the acute setting, and no post acute OT is anticipated.         If plan is discharge home, recommend the following: A little help with walking and/or transfers;A little help with bathing/dressing/bathroom;Assist for transportation    Functional Status Assessment  Patient has had a recent decline in their functional status and demonstrates the ability to make significant improvements in function in a reasonable and predictable amount of time.  Equipment Recommendations  None recommended by OT    Recommendations for Other Services       Precautions / Restrictions Precautions Precautions: Fall Precaution Comments: Watch HR Restrictions Weight Bearing Restrictions Per Provider Order: No      Mobility Bed Mobility Overal bed mobility: Independent                  Transfers Overall transfer level: Needs assistance   Transfers: Sit to/from Stand, Bed to chair/wheelchair/BSC Sit to Stand: Independent      Step pivot transfers: Supervision     General transfer comment: short stride and slowed pace      Balance Overall balance assessment: Mild deficits observed, not formally tested                                         ADL either performed or assessed with clinical judgement   ADL       Grooming: Wash/dry hands;Oral care;Standing;Supervision/safety               Lower Body Dressing: Supervision/safety;Sit to/from stand   Toilet Transfer: Retail banker;Ambulation                   Vision Baseline Vision/History: 1 Wears glasses Patient Visual Report: No change from baseline       Perception Perception: Within Functional Limits       Praxis Praxis: WFL       Pertinent Vitals/Pain Pain Assessment Pain Assessment: Faces Faces Pain Scale: Hurts a little bit Pain Location: chest Pain Descriptors / Indicators: Aching Pain Intervention(s): Monitored during session     Extremity/Trunk Assessment Upper Extremity Assessment Upper Extremity Assessment: Overall WFL for tasks assessed   Lower Extremity Assessment Lower Extremity Assessment: Defer to PT evaluation   Cervical / Trunk Assessment Cervical / Trunk Assessment: Normal   Communication Communication Communication: No apparent difficulties   Cognition Arousal: Alert Behavior During Therapy: WFL for tasks assessed/performed Overall Cognitive Status: Within Functional  Limits for tasks assessed                                       General Comments       Exercises     Shoulder Instructions      Home Living Family/patient expects to be discharged to:: Private residence Living Arrangements: Spouse/significant other Available Help at Discharge: Family;Available 24 hours/day Type of Home: House Home Access: Stairs to enter [employer] of Steps: 2   Home Layout: Laundry or work area in basement;One level     Bathroom  Shower/Tub: Tub/shower unit;Walk-in shower   Bathroom Toilet: Standard Bathroom Accessibility: Yes How Accessible: Accessible via walker Home Equipment: Cane - single point;Shower seat          Prior Functioning/Environment Prior Level of Function : Independent/Modified Independent;Driving             Mobility Comments: Uses no device at baseline, SPC is spouses ADLs Comments: Ind with ADL, iADL, cares for spouse who has dementia        OT Problem List: Decreased activity tolerance;Impaired balance (sitting and/or standing)      OT Treatment/Interventions: Self-care/ADL training;Therapeutic activities;Balance training;Energy conservation    OT Goals(Current goals can be found in the care plan section) Acute Rehab OT Goals Patient Stated Goal: Return home OT Goal Formulation: With patient Time For Goal Achievement: 03/12/23 Potential to Achieve Goals: Good ADL Goals Pt Will Perform Grooming: Independently;standing Pt Will Perform Lower Body Dressing: Independently;sit to/from stand Pt Will Transfer to Toilet: Independently;ambulating;regular height toilet  OT Frequency: Min 1X/week    Co-evaluation              AM-PAC OT "6 Clicks" Daily Activity     Outcome Measure Help from another person eating meals?: None Help from another person taking care of personal grooming?: None Help from another person toileting, which includes using toliet, bedpan, or urinal?: A Little Help from another person bathing (including washing, rinsing, drying)?: A Little Help from another person to put on and taking off regular upper body clothing?: None Help from another person to put on and taking off regular lower body clothing?: A Little 6 Click Score: 21   End of Session Equipment Utilized During Treatment: Gait belt Nurse Communication: Mobility status  Activity Tolerance: Patient tolerated treatment well Patient left: in bed;with call bell/phone within reach  OT Visit  Diagnosis: Unsteadiness on feet (R26.81)                Time: 1610-9604 OT Time Calculation (min): 22 min Charges:  OT General Charges $OT Visit: 1 Visit OT Evaluation $OT Eval Moderate Complexity: 1 Mod  02/26/2023  RP, OTR/L  Acute Rehabilitation Services  Office:  [phone]   Suzanna Obey 02/26/2023, 5:09 PM

## 2023-02-26 NOTE — ED Notes (Signed)
 [facility] called for transport, pt bed assignment is ready

## 2023-02-26 NOTE — Progress Notes (Signed)
   02/26/23 1600  Spiritual Encounters  Type of Visit Initial  Care provided to: Patient  Referral source Patient request  Reason for visit Routine spiritual support  OnCall Visit No  Spiritual Framework  Presenting Themes Meaning/purpose/sources of inspiration;Impactful experiences and emotions  Values/beliefs faith  Strengths faith  Needs/Challenges/Barriers lack support  Patient Stress Factors Health changes;Loss  Interventions  Spiritual Care Interventions Made Reflective listening;Compassionate presence;Established relationship of care and support;Normalization of emotions;Meaning making;Prayer  Intervention Outcomes  Outcomes Connection to spiritual care;Awareness of support;Reduced isolation   Chaplain responded to request for emotional and spiritual support. There was no family present at bedside. Patient is going through emotional distress because this is the fifth times she has been at the hospital this week. She is tired and exhausted. She does not have any family support. Her husband is sick, he suffered from advance dementia. She has a daughter but she can't rely on her for emotional support. She feels lonely. Patient belongs to the [place] faith. She draws hope and strength from her faith. Chaplain helped pt process her thoughts and feelings and made her feel comforted. Chaplain will follow-up tomorrow.

## 2023-02-26 NOTE — Progress Notes (Signed)
Amber Stone,  Your BMP and BNP look great. Your sodium level is normal. Please continue with scheduling the echo and carotid ultrasound as discussed.

## 2023-02-26 NOTE — Progress Notes (Signed)
Pt c/o of 8/10 CP. She did not want nitroglycerin and asked for Tylenol since it had helped when she had CP this am. She did have visitors that she was unhappy with that had just left when the CP started. She does not feel like it was caused by anxiety. Tylenol given, continue to monitor.

## 2023-02-26 NOTE — H&P (Addendum)
History and Physical    Chaylen Falck Magoon OZH:[id number] DOB: [birth year] DOA: 02/25/2023  PCP: de [place], Raymond J, MD  Patient coming from: home  I have personally briefly reviewed patient's old medical records in [facility]  Chief Complaint: shortness of breath, chest pain, malaise  HPI: Adylin Piske is Joel Mericle 62 y.o. female with medical history significant of asthma, allergies, depression, GERD, and multiple other medical issues here with progressive malaise, SOB with exertion, weakness, and chest pain.  She notes over the past month, she's been more and more tired.  Unable to do things like normal around the house.  Her dog walks getting shorter and shorter due to dyspnea with exertion.  Legs feel heavy and tired.  She's just been progressively not feeling well.   She went to the ED on last Saturday with SOB with exertion and tingling in L arm.  She had workup including CT PE protocol and plan was for cardiology and PCP follow up.  She notes since then she's had right upper extremity cellulitis.  Feels "worse and worse and weaker and weaker".  She notes worse chest pain with exertion.  Achy/pressure.  Last about 3 min.  Comes and goes, coming more frequently.  She notes no "real" CP until yesterday.  Notes better with rest and worse with activity.    No fevers, chills, cough, nausea, vomiting, bowel/bladder issues.  No smoking, drinking.  ED Course: labs, EKG, imaging.  Admit for syncope workup.   Review of Systems: As per HPI otherwise all other systems reviewed and are negative.  Past Medical History:  Diagnosis Date   Allergic rhinitis    Allergy to chemicals    Arthritis    hands   Asthma    Asthma due to environmental allergies    Chest pain 03/16/2014   Complication of anesthesia    hard to wake up   Depression    GERD (gastroesophageal reflux disease)    OSA (obstructive sleep apnea)    lost weight, not used CPAP in over 5 yrs   Soft tissue mass, left anterior  costal margin 03/26/2013   TONSILLECTOMY, HX OF 12/19/2006   Annotation: 1978 Qualifier: Diagnosis of  By: Laural Benes RN, Erika     Vocal cord dysfunction 12/19/2006   Qualifier: Diagnosis of  By: Coralee North      Past Surgical History:  Procedure Laterality Date   BREAST BIOPSY Right 05/22/2019   benign   BREAST LUMPECTOMY WITH RADIOACTIVE SEED LOCALIZATION Right 04/24/2019   Procedure: RIGHT BREAST LUMPECTOMY WITH RADIOACTIVE SEED X 2;  Surgeon: Abigail Miyamoto, MD;  Location: [facility];  Service: General;  Laterality: Right;   BREAST SURGERY     bx right side   CHOLECYSTECTOMY     COLONOSCOPY N/Liberta Gimpel 12/30/2014   Procedure: COLONOSCOPY;  Surgeon: Malissa Hippo, MD;  Location: [facility];  Service: Endoscopy;  Laterality: N/Tashauna Caisse;  730   GANGLION CYST EXCISION     left hand   LASIK  02/28/1995   LIPOMA EXCISION     lower back   NASAL SINUS SURGERY     tonsillectomy  02/28/1976    Social History  reports that she has never smoked. She has never been exposed to tobacco smoke. She has never used smokeless tobacco. She reports that she does not drink alcohol and does not use drugs.  Allergies  Allergen Reactions   Egg-Derived Products    Other     Clams, hazelnuts.,  Chemical sensitivities, many fruits, veggies, grass, trees    Family History  Problem Relation Age of Onset   Coronary artery disease Mother    Rheum arthritis Mother    Coronary artery disease Father    Heart disease Father    Cancer Maternal Grandfather        leukemia   Cancer Paternal Grandmother        breast   Emphysema Other    Cancer Paternal Aunt        breast   Prior to Admission medications   Medication Sig Start Date End Date Taking? Authorizing Provider  albuterol (VENTOLIN HFA) 108 (90 Base) MCG/ACT inhaler Inhale 2 puffs into the lungs every 4 (four) hours as needed for wheezing or shortness of breath.   Yes [provider]  budesonide-formoterol (SYMBICORT)  160-4.5 MCG/ACT inhaler Inhale 2 puffs into the lungs 2 (two) times daily. Patient taking differently: Inhale 2 puffs into the lungs in the morning. 10/17/13  Yes Byrum, Les Pou, MD  cetirizine (ZYRTEC) 10 MG tablet Take 10 mg by mouth daily.   Yes [provider]  Cholecalciferol (VITAMIN D-3) 5000 UNITS TABS Take 1 tablet by mouth daily.   Yes [provider]  clemastine (TAVIST) 2.68 MG TABS tablet Take 2.68 mg by mouth 2 (two) times daily. Take 2.68mg  (1 tablet) by mouth every morning and 6.68-5.36mg  (1-2 tablets) at night.   Yes [provider]  clindamycin (CLEOCIN) 300 MG capsule Take 300 mg by mouth 3 (three) times daily. 02/19/23  Yes [provider]  famotidine (PEPCID) 40 MG tablet Take 40 mg by mouth 2 (two) times daily. 02/24/23  Yes [provider]  hydrOXYzine (ATARAX/VISTARIL) 25 MG tablet Take 25 mg by mouth at bedtime.   Yes [provider]  ipratropium-albuterol (DUONEB) 0.5-2.5 (3) MG/3ML SOLN Inhale 3 mLs into the lungs every 6 (six) hours as needed (shortness of breath wheezing).  02/06/13  Yes [provider]  montelukast (SINGULAIR) 10 MG tablet Take 10 mg by mouth at bedtime.    [provider]  Multiple Vitamin (MULTIVITAMIN) capsule Take 1 capsule by mouth daily.    [provider]  omeprazole (PRILOSEC) 40 MG capsule TAKE ONE CAPSULE BY MOUTH DAILY 11/14/22   de Peru, Buren Kos, MD  tiotropium (SPIRIVA) 18 MCG inhalation capsule Place 18 mcg into inhaler and inhale daily.    [provider]    Physical Exam: Vitals:   02/26/23 0730 02/26/23 0854 02/26/23 1124 02/26/23 1127  BP: (!) 150/81   (!) 162/68  Pulse: 66     Resp: 17     Temp:  98.2 F (36.8 C)  98.6 F (37 C)  TempSrc:  Oral  Oral  SpO2: 97%   98%  Weight:   75.7 kg   Height:   5\' 7"  (1.702 m)     Constitutional: NAD, calm, comfortable Vitals:   02/26/23 0730 02/26/23 0854 02/26/23 1124 02/26/23 1127  BP: (!)  150/81   (!) 162/68  Pulse: 66     Resp: 17     Temp:  98.2 F (36.8 C)  98.6 F (37 C)  TempSrc:  Oral  Oral  SpO2: 97%   98%  Weight:   75.7 kg   Height:   5\' 7"  (1.702 m)    Eyes: PERRL ENMT: Mucous membranes are moist.  Neck: normal, supple Respiratory: clear to auscultation bilaterally, no wheezing, no crackles.  Cardiovascular: systolic murmur 3/6, regular Abdomen: no  tenderness, no masses palpated.  Musculoskeletal: no clubbing / cyanosis. No joint deformity upper and lower extremities. Good ROM, no contractures. Normal muscle tone.  Skin: no rashes, lesions, ulcers.  Neurologic: CN 2-12 intact. Sensation intact to light touch.  5/5 strength to upper and lowers.  FNF and heel to shin normal bilaterally. Psychiatric: Normal judgment and insight. Alert and oriented x 3. Normal mood.   Labs on Admission: I have personally reviewed following labs and imaging studies  CBC: Recent Labs  Lab 02/25/23 2236  WBC 9.0  HGB 12.7  HCT 36.1  MCV 91.6  PLT 403*    Basic Metabolic Panel: Recent Labs  Lab 02/23/23 0911 02/25/23 2202  NA 135 131*  K 4.9 4.3  CL 98 100  CO2 23 23  GLUCOSE 95 111*  BUN 13 18  CREATININE 0.84 0.78  CALCIUM 10.1 9.2    GFR: Estimated Creatinine Clearance: 76.4 mL/min (by C-G formula based on SCr of 0.78 mg/dL).  Liver Function Tests: No results for input(s): "AST", "ALT", "ALKPHOS", "BILITOT", "PROT", "ALBUMIN" in the last 168 hours.  Urine analysis:    Component Value Date/Time   COLORURINE YELLOW 12/22/2008 0917   APPEARANCEUR CLEAR 12/22/2008 0917   LABSPEC 1.006 12/22/2008 0917   PHURINE 6.0 12/22/2008 0917   GLUCOSEU NEGATIVE 12/22/2008 0917   HGBUR TRACE (Kamoni Depree) 12/22/2008 0917   BILIRUBINUR NEGATIVE 12/22/2008 0917   KETONESUR NEGATIVE 12/22/2008 0917   PROTEINUR NEGATIVE 12/22/2008 0917   UROBILINOGEN 0.2 12/22/2008 0917   NITRITE NEGATIVE 12/22/2008 0917   LEUKOCYTESUR NEGATIVE 12/22/2008 0917    Radiological Exams on  Admission: DG Chest Port 1 View Result Date: 02/25/2023 CLINICAL DATA:  Left-sided chest pain for several hours, initial encounter EXAM: PORTABLE CHEST 1 VIEW COMPARISON:  02/17/2023 FINDINGS: Cardiac shadow is within normal limits. Aortic calcifications are noted. Lungs are well aerated bilaterally. No bony abnormality is noted. IMPRESSION: No active disease. Electronically Signed   By: Alcide Clever M.D.   On: 02/25/2023 22:44    EKG: Independently reviewed. sinus  Assessment/Plan Active Problems:   Chest pain   Assessment and Plan:  Chest Pain Ruled out for ACS.  Negative troponins x2.  EKG appears similar to prior.  Elevated d dimer, will follow CT PE protocol  Echo as below If she continues to have exertional CP here, will consider cardiology consult.   Shortness of breath with exertion Presyncope  Systolic Murmur   Follow echo  BNP Orthostatics PT/OT  Consider cardiology consult if significant abnormalities on echo   Paresthesias to Bilateral Hands Consider further workup with MRI C spine outpatient +/- neurology follow up Neuro exam reassuring here, mild to no symptoms at this point  Right Upper Extremity Cellulitis She's had 7 days antibiotics, RUE erythema/induration very mild -> will d/c abx and monitor   Asthma Continue home nebs and singulair  Allergies Was previously on allergy shots On several antihistamines at home  GERD PPI     DVT prophylaxis: lovenox  Code Status:   full  Family Communication:  none  Disposition Plan:   Patient is from:  home  Anticipated DC to:  home  Anticipated DC date:  Pending improvement  Anticipated DC barriers: Pending improvement  Consults called:  none  Admission status:  obs   Severity of Illness: The appropriate patient status for this patient is OBSERVATION. Observation status is judged to be reasonable and necessary in order to provide the required intensity of service to ensure the patient's safety. The  patient's presenting symptoms, physical exam findings, and initial radiographic and laboratory data in the context of their medical condition is felt to place them at decreased risk for further clinical deterioration. Furthermore, it is anticipated that the patient will be medically stable for discharge from the hospital within 2 midnights of admission.     Lacretia Nicks MD [facility]  How to contact the [facility] Attending or Consulting provider 7A - 7P or covering provider during after hours 7P -7A, for this patient?   Check the care team in [facility] and look for Olena Willy) attending/consulting [facility] provider listed and b) the [facility] team listed Log into [email or web address] and use [facility] universal password to access. If you do not have the password, please contact the hospital operator. Locate the [facility] provider you are looking for under [facility] and page to Tracer Gutridge number that you can be directly reached. If you still have difficulty reaching the provider, please page the [facility] (Director on Call) for the Hospitalists listed on amion for assistance.  02/26/2023, 12:41 PM

## 2023-02-27 ENCOUNTER — Observation Stay (HOSPITAL_COMMUNITY): Payer: No Typology Code available for payment source

## 2023-02-27 ENCOUNTER — Encounter (HOSPITAL_COMMUNITY)
Admission: EM | Disposition: A | Discharge status: Home Health | Payer: Self-pay | Source: Home / Self Care | Attending: Family Medicine

## 2023-02-27 DIAGNOSIS — I200 Unstable angina: Secondary | ICD-10-CM

## 2023-02-27 DIAGNOSIS — I2510 Atherosclerotic heart disease of native coronary artery without angina pectoris: Secondary | ICD-10-CM | POA: Diagnosis not present

## 2023-02-27 DIAGNOSIS — R079 Chest pain, unspecified: Secondary | ICD-10-CM | POA: Diagnosis not present

## 2023-02-27 DIAGNOSIS — E782 Mixed hyperlipidemia: Secondary | ICD-10-CM | POA: Diagnosis not present

## 2023-02-27 HISTORY — PX: LEFT HEART CATH AND CORONARY ANGIOGRAPHY: CATH118249

## 2023-02-27 LAB — COMPREHENSIVE METABOLIC PANEL
ALT: 15 U/L (ref 0–44)
AST: 13 U/L — ABNORMAL LOW (ref 15–41)
Albumin: 3.4 g/dL — ABNORMAL LOW (ref 3.5–5.0)
Alkaline Phosphatase: 54 U/L (ref 38–126)
Anion gap: 9 (ref 5–15)
BUN: 10 mg/dL (ref 8–23)
CO2: 21 mmol/L — ABNORMAL LOW (ref 22–32)
Calcium: 9.2 mg/dL (ref 8.9–10.3)
Chloride: 106 mmol/L (ref 98–111)
Creatinine, Ser: 0.67 mg/dL (ref 0.44–1.00)
GFR, Estimated: >60 mL/min (ref >60)
Glucose, Bld: 94 mg/dL (ref 70–99)
Potassium: 3.8 mmol/L (ref 3.5–5.1)
Sodium: 136 mmol/L (ref 135–145)
Total Bilirubin: 0.3 mg/dL (ref 0.0–1.2)
Total Protein: 6.3 g/dL — ABNORMAL LOW (ref 6.5–8.1)

## 2023-02-27 LAB — POCT ACTIVATED CLOTTING TIME: Activated Clotting Time: 199 s

## 2023-02-27 LAB — CBC
HCT: 37 % (ref 36.0–46.0)
Hemoglobin: 12.7 g/dL (ref 12.0–15.0)
MCH: 31.9 pg (ref 26.0–34.0)
MCHC: 34.3 g/dL (ref 30.0–36.0)
MCV: 93 fL (ref 80.0–100.0)
Platelets: 408 10*3/uL — ABNORMAL HIGH (ref 150–400)
RBC: 3.98 MIL/uL (ref 3.87–5.11)
RDW: 13.8 % (ref 11.5–15.5)
WBC: 7.6 10*3/uL (ref 4.0–10.5)
nRBC: 0 % (ref 0.0–0.2)

## 2023-02-27 LAB — C-REACTIVE PROTEIN: CRP: 0.5 mg/dL (ref <1.0)

## 2023-02-27 SURGERY — LEFT HEART CATH AND CORONARY ANGIOGRAPHY
Anesthesia: LOCAL

## 2023-02-27 MED ORDER — SODIUM CHLORIDE 0.9% FLUSH: 3.0000 mL | INTRAVENOUS | Status: DC | PRN

## 2023-02-27 MED ORDER — SODIUM CHLORIDE 0.9 % IV SOLN: 250.0000 mL | INTRAVENOUS | Status: DC | PRN

## 2023-02-27 MED ORDER — IOHEXOL 350 MG/ML SOLN
INTRAVENOUS | Status: DC | PRN
  Administered 2023-02-27: 55 mL via INTRA_ARTERIAL

## 2023-02-27 MED ORDER — SODIUM CHLORIDE 0.9 % IV SOLN: INTRAVENOUS | Status: AC

## 2023-02-27 MED ORDER — HEPARIN SODIUM (PORCINE) 1000 UNIT/ML IJ SOLN
INTRAMUSCULAR | Status: DC | PRN
  Administered 2023-02-27: 4000 [IU] via INTRAVENOUS

## 2023-02-27 MED ORDER — MIDAZOLAM HCL 5 MG/5ML IJ SOLN
INTRAMUSCULAR | Status: AC
Start: 2023-02-27 — End: ?
  Filled 2023-02-27: qty 5

## 2023-02-27 MED ORDER — FENTANYL CITRATE (PF) 100 MCG/2ML IJ SOLN
INTRAMUSCULAR | Status: AC
  Filled 2023-02-27: qty 2

## 2023-02-27 MED ORDER — SODIUM CHLORIDE 0.9% FLUSH
3.0000 mL | Freq: Two times a day (BID) | INTRAVENOUS | Status: DC
  Administered 2023-02-27: 3 mL via INTRAVENOUS

## 2023-02-27 MED ORDER — ASPIRIN 81 MG PO CHEW
81.0000 mg | CHEWABLE_TABLET | ORAL | Status: AC
  Administered 2023-02-27: 81 mg via ORAL
  Filled 2023-02-27: qty 1

## 2023-02-27 MED ORDER — NITROGLYCERIN 1 MG/10 ML FOR IR/CATH LAB
INTRA_ARTERIAL | Status: AC
  Filled 2023-02-27: qty 10

## 2023-02-27 MED ORDER — LIDOCAINE HCL (PF) 1 % IJ SOLN
INTRAMUSCULAR | Status: AC
Start: 2023-02-27 — End: ?
  Filled 2023-02-27: qty 30

## 2023-02-27 MED ORDER — NITROGLYCERIN 1 MG/10 ML FOR IR/CATH LAB
INTRA_ARTERIAL | Status: DC | PRN
  Administered 2023-02-27: 200 ug via INTRA_ARTERIAL

## 2023-02-27 MED ORDER — LIDOCAINE HCL (PF) 1 % IJ SOLN
INTRAMUSCULAR | Status: DC | PRN
  Administered 2023-02-27: 2 mL
  Administered 2023-02-27: 12 mL

## 2023-02-27 MED ORDER — VERAPAMIL HCL 2.5 MG/ML IV SOLN
INTRAVENOUS | Status: DC | PRN
  Administered 2023-02-27 (×2): 10 mL via INTRA_ARTERIAL

## 2023-02-27 MED ORDER — ONDANSETRON HCL 4 MG/2ML IJ SOLN: 4.0000 mg | Freq: Four times a day (QID) | INTRAMUSCULAR | Status: DC | PRN

## 2023-02-27 MED ORDER — FENTANYL CITRATE (PF) 100 MCG/2ML IJ SOLN
INTRAMUSCULAR | Status: DC | PRN
  Administered 2023-02-27 (×2): 25 ug via INTRAVENOUS

## 2023-02-27 MED ORDER — LABETALOL HCL 5 MG/ML IV SOLN
10.0000 mg | INTRAVENOUS | Status: AC | PRN
Start: 2023-02-27 — End: 2023-02-27

## 2023-02-27 MED ORDER — MIDAZOLAM HCL 2 MG/2ML IJ SOLN
INTRAMUSCULAR | Status: DC | PRN
  Administered 2023-02-27 (×2): 2 mg via INTRAVENOUS

## 2023-02-27 MED ORDER — ROSUVASTATIN CALCIUM 5 MG PO TABS
10.0000 mg | ORAL_TABLET | Freq: Every day | ORAL | Status: DC
  Administered 2023-02-27 – 2023-02-28 (×2): 10 mg via ORAL
  Filled 2023-02-27 (×2): qty 2

## 2023-02-27 MED ORDER — SODIUM CHLORIDE 0.9 % WEIGHT BASED INFUSION
1.0000 mL/kg/h | INTRAVENOUS | Status: DC
  Administered 2023-02-27: 1 mL/kg/h via INTRAVENOUS

## 2023-02-27 MED ORDER — IOHEXOL 350 MG/ML SOLN
75.0000 mL | Freq: Once | INTRAVENOUS | Status: AC | PRN
  Administered 2023-02-27: 75 mL via INTRAVENOUS

## 2023-02-27 MED ORDER — SODIUM CHLORIDE 0.9 % WEIGHT BASED INFUSION: 3.0000 mL/kg/h | INTRAVENOUS | Status: DC

## 2023-02-27 MED ORDER — HYDRALAZINE HCL 20 MG/ML IJ SOLN: 10.0000 mg | INTRAMUSCULAR | Status: AC | PRN

## 2023-02-27 MED ORDER — HEPARIN (PORCINE) IN NACL 1000-0.9 UT/500ML-% IV SOLN
INTRAVENOUS | Status: DC | PRN
  Administered 2023-02-27 (×2): 500 mL

## 2023-02-27 SURGICAL SUPPLY — 15 items
CATH 5FR JL3.5 JR4 ANG PIG MP (CATHETERS) IMPLANT
CATH INFINITI 4FR JL3.5 (CATHETERS) IMPLANT
CLOSURE MYNX CONTROL 5F (Vascular Products) IMPLANT
DEVICE RAD COMP TR BAND LRG (VASCULAR PRODUCTS) IMPLANT
GLIDESHEATH SLEND SS 6F .021 (SHEATH) IMPLANT
GUIDEWIRE ANGLED .035X150CM (WIRE) IMPLANT
GUIDEWIRE INQWIRE 1.5J.035X260 (WIRE) IMPLANT
INQWIRE 1.5J .035X260CM (WIRE) ×1
KIT HEART LEFT (KITS) IMPLANT
KIT MICROPUNCTURE NIT STIFF (SHEATH) IMPLANT
PACK CARDIAC CATHETERIZATION (CUSTOM PROCEDURE TRAY) ×1 IMPLANT
SHEATH PINNACLE 5F 10CM (SHEATH) IMPLANT
SHEATH PROBE COVER 6X72 (BAG) IMPLANT
TUBING CIL FLEX 10 FLL-RA (TUBING) IMPLANT
WIRE EMERALD 3MM-J .035X150CM (WIRE) IMPLANT

## 2023-02-27 NOTE — Interval H&P Note (Signed)
 History and Physical Interval Note:  02/27/2023 2:31 PM  Amber Stone  has presented today for surgery, with the diagnosis of chest pain.  The various methods of treatment have been discussed with the patient and family. After consideration of risks, benefits and other options for treatment, the patient has consented to  Procedure(s): LEFT HEART CATH AND CORONARY ANGIOGRAPHY (N/A) as a surgical intervention.  The patient's history has been reviewed, patient examined, no change in status, stable for surgery.  I have reviewed the patient's chart and labs.  Questions were answered to the patient's satisfaction.     Ozell Fell

## 2023-02-27 NOTE — Consult Note (Signed)
 Cardiology Consultation   Patient ID: Amber Stone MRN: [id number]; DOB: 1960/05/24  Admit date: 02/25/2023 Date of Consult: 02/27/2023  PCP:  de Cuba, Quintin PARAS, MD   [facility] Providers Cardiologist:  None   {  Patient Profile:   Amber Stone is a 62 y.o. female with a hx of asthma, allergies, GERD, hyperlipidemia who is being seen 02/27/2023 for the evaluation of chest pain, fatigue, shortness of breath at the request of Dr. Perri.  History of Present Illness:   Patient has a  history of asthma, allergies, GERD, and hyperlipidemia who presented to the ED complaining of shortness of breath, decrease exercise tolerance, chest pain and weakness all with exertion. She states that originally her symptoms began about 2 months ago starting with just a decrease in exercise tolerance. She states she used to be able to walk her dog 5 times per day without any issue and she starting to feel short of breath and fatigue. Over two months this progressed to her having difficulty being able to complete normal ADLs. She takes care of her husband with dementia at home and said that when she was cooking him eggs for breakfast she started having to stop and sit down as she was becoming fatigued. All of these symptoms were progressing over 2 months but became drastically worse over the last 2 weeks. Patient reports that her baseline is minimal fatigue, very active lifestyle and frequent daily exercise.  She began having chest pain Saturday 02/24/2023. She describes the chest pain as left sided, radiating to her left shoulder and arm. She states the pain stays constant at 2-3/10 but gets up to 8/10 with exertion, as little as walking around the room. She denies any nausea, vomiting or syncope but admits to having diaphoresis as well as feeling lightheaded during these episodes of more intense chest pain. She denies any prior history of any cardiac events.  She has been managing her  hyperlipidemia with her PCP he had recommended her be on a statin however she admits to having muscle pain while on statins and her and her PCP decided to no longer pursue statin therapy. Her most recent lipid panel from 12/2022 showed total cholesterol of 293, LDL of 213, HDL of 61.  She admits that her PCP found a systolic murmur within the past year.  Her PCP noted in 06/2022 that she did have a systolic murmur and was experiencing occasional dyspnea at times with exertion.  It is noted that at that time she had not had any changes to activity based on the symptoms, denied any lightheadedness syncope or dizziness.  Her PCP at this time had discussed ordering an echocardiogram which the patient preferred to hold off on.  PCP discussed the concerns and decided to not order the echocardiogram and continue to monitor the murmur moving forward.  Patient originally presented to the [facility] emergency department on 02-17-2023.  At this time she presented with shortness of breath and some pain and tingling in her left arm that was also happening with exertion. She had negative troponin x2, EKG showing LVH, and she had a positive d-dimer which lead to them ordering a CTA to rule out PE which was negative. Patient was then sent home from ED with ambulatory cardiology referral to work up her murmur and increased SOB.    Patient then presented to [facility] emergency department at [place] on 02/20/2023 complaining of arm pain related to the IV that she had placed  when she was seen at [facility].  She had been seen in between an urgent care and was given clindamycin for cellulitis.  She came in as she believes that the pain around the site of the cellulitis have become more intense.  She was worked up for DVT which was negative.  She was already adequately taking clindamycin for cellulitis which she was instructed to continue taking.  She was then discharged home with close outpatient follow-up and return  precautions.  Patient was then seen at [facility] on 02/23/2023 as an emergency department follow-up.  She reported at this time that over the past week she has had improved difficulty with exertional activity such as walking long distances walking up the stairs and has noticed some increased fatigue.  At that time she had noticed some mild edema to her bilateral lower extremities.  She also had reported some mild dizziness with positional changes but denied any syncope or near syncope chest pain palpitations or vision changes.  Patient then presented to the [facility] emergency department [facility] [place] on 02/25/2023 complaining of chest pain.  At this point she states that she notices gotten worse since she has been home and she is having worse lightheadedness and some intermittent chest pain.  At this time she described the chest pain is more of a pressure sensation and does agree that she has some dyspnea with this chest pain.  She reports that she was unable to get an appointment with cardiology until February so she came to the emergency department today because her symptoms were getting worse.  Her EKG was interpreted as sinus rhythm with a normal axis normal intervals and no significant ST changes.  Her initial troponin was negative other labs are reassuring.  At this time given her worsening symptoms with her murmur she was placed for admission in the hospital.  While at [facility] so far she has had negative troponins x 2, her EKG appears similar to her previous EKGs.  She did have an elevated D-dimer which then prompted a CTA to rule out a pulmonary embolism which was negative.  With her increased symptoms an echocardiogram was ordered which showed the following: LVEF of 60 to 65%, no regional wall motion abnormalities, left ventricular diastolic parameters are consistent with grade 1 diastolic dysfunction.  Right ventricular systolic function and  size is normal, left atrium  was moderately dilated, trivial mitral regurgitation, mild to moderate aortic valve regurgitation, mild aortic valve stenosis, IVC was normal in size.   She has a positive family history with her father dying at age 19 from an MI, mother had an MI at age 81 status post CABG still living, father with heart failure.  She denies any current alcohol use saying she stopped all drinking in 2011.  She denies any tobacco use currently or in the past.  She admits to some marijuana use in her 60s.  She has only been taking Tylenol  while on the floor for her chest pain.  Past Medical History:  Diagnosis Date   Allergic rhinitis    Allergy to chemicals    Arthritis    hands   Asthma    Asthma due to environmental allergies    Chest pain 03/16/2014   Complication of anesthesia    hard to wake up   Depression    GERD (gastroesophageal reflux disease)    OSA (obstructive sleep apnea)    lost weight, not used CPAP in over  5 yrs   Soft tissue mass, left anterior costal margin 03/26/2013   TONSILLECTOMY, HX OF 12/19/2006   Annotation: 1978 Qualifier: Diagnosis of  By: Vicci RN, Erika     Vocal cord dysfunction 12/19/2006   Qualifier: Diagnosis of  By: Vicci OBIE Asal      Past Surgical History:  Procedure Laterality Date   BREAST BIOPSY Right 05/22/2019   benign   BREAST LUMPECTOMY WITH RADIOACTIVE SEED LOCALIZATION Right 04/24/2019   Procedure: RIGHT BREAST LUMPECTOMY WITH RADIOACTIVE SEED X 2;  Surgeon: Vernetta Berg, MD;  Location: [facility];  Service: General;  Laterality: Right;   BREAST SURGERY     bx right side   CHOLECYSTECTOMY     COLONOSCOPY N/A 12/30/2014   Procedure: COLONOSCOPY;  Surgeon: Claudis RAYMOND Rivet, MD;  [facility];  Service: Endoscopy;  Laterality: N/A;  730   GANGLION CYST EXCISION     left hand   LASIK  02/28/1995   LIPOMA EXCISION     lower back   NASAL SINUS SURGERY     tonsillectomy  02/28/1976     Home Medications:  Prior to  Admission medications   Medication Sig Start Date End Date Taking? Authorizing Provider  albuterol  (VENTOLIN  HFA) 108 (90 Base) MCG/ACT inhaler Inhale 2 puffs into the lungs every 4 (four) hours as needed for wheezing or shortness of breath.   Yes [provider]  budesonide -formoterol  (SYMBICORT ) 160-4.5 MCG/ACT inhaler Inhale 2 puffs into the lungs 2 (two) times daily. Patient taking differently: Inhale 2 puffs into the lungs in the morning. 10/17/13  Yes Byrum, Lamar RAMAN, MD  cetirizine (ZYRTEC) 10 MG tablet Take 10 mg by mouth daily.   Yes [provider]  Cholecalciferol (VITAMIN D-3) 5000 UNITS TABS Take 1 tablet by mouth daily.   Yes [provider]  clemastine (TAVIST) 2.68 MG TABS tablet Take 2.68 mg by mouth 2 (two) times daily. Take 2.68mg  (1 tablet) by mouth every morning and 6.68-5.36mg  (1-2 tablets) at night.   Yes [provider]  clindamycin (CLEOCIN) 300 MG capsule Take 300 mg by mouth 3 (three) times daily. 02/19/23  Yes [provider]  famotidine  (PEPCID ) 40 MG tablet Take 40 mg by mouth 2 (two) times daily. 02/24/23  Yes [provider]  hydrOXYzine  (ATARAX /VISTARIL ) 25 MG tablet Take 25 mg by mouth at bedtime.   Yes [provider]  ipratropium-albuterol  (DUONEB) 0.5-2.5 (3) MG/3ML SOLN Inhale 3 mLs into the lungs every 6 (six) hours as needed (shortness of breath wheezing).  02/06/13  Yes [provider]  Lifitegrast CARON OP) Place 1 drop into both eyes 2 (two) times daily.   Yes [provider]  montelukast  (SINGULAIR ) 10 MG tablet Take 10 mg by mouth in the morning.   Yes [provider]  Multiple Vitamin (MULTIVITAMIN) capsule Take 1 capsule by mouth daily.   Yes [provider]  omeprazole  (PRILOSEC) 40 MG capsule TAKE ONE CAPSULE BY MOUTH DAILY 11/14/22  Yes de Cuba, Raymond J, MD  tiotropium (SPIRIVA ) 18 MCG inhalation capsule Place 18 mcg into inhaler and inhale daily.    Yes [provider]    Inpatient Medications: Scheduled Meds:  enoxaparin  (LOVENOX ) injection  40 mg Subcutaneous Q24H   famotidine   40 mg Oral BID   loratadine   10 mg Oral Daily   mometasone -formoterol   2 puff Inhalation BID   montelukast   10 mg Oral QHS   pantoprazole   40 mg Oral Daily  rosuvastatin   10 mg Oral Daily   umeclidinium bromide   1 puff Inhalation Daily   Continuous Infusions:  PRN Meds: acetaminophen  **OR** acetaminophen , albuterol , hydrOXYzine , oxyCODONE , polyethylene glycol  Allergies:    Allergies  Allergen Reactions   Egg-Derived Products    Other     Clams, hazelnuts., Chemical sensitivities, many fruits, veggies, grass, trees    Social History:   Social History   Socioeconomic History   Marital status: Married    Spouse name: Not on file   Number of children: Not on file   Years of education: Not on file   Highest education level: Not on file  Occupational History    Comment: [employer]   Tobacco Use   Smoking status: Never    Passive exposure: Never   Smokeless tobacco: Never  Vaping Use   Vaping status: Never Used  Substance and Sexual Activity   Alcohol use: No   Drug use: No   Sexual activity: Not Currently    Birth control/protection: Post-menopausal  Other Topics Concern   Not on file  Social History Narrative   Lives with husband Al 24 years together      Enjoys: crafts, read, bake      Diet: limited due to food allergies- see allergy list    Caffeine:  3-4 cups daily   Water : 4 cups daily working to increase       Wears seat belt    Does not use phone while driving    Smoke Production Designer, Theatre/television/film    No weapons    Social Drivers of Corporate Investment Banker Strain: Low Risk  (02/10/2020)   Overall Financial Resource Strain (CARDIA)    Difficulty of Paying Living Expenses: Not hard at all  Food Insecurity: No Food Insecurity (02/26/2023)   Hunger Vital Sign    Worried About Running Out  of Food in the Last Year: Never true    Ran Out of Food in the Last Year: Never true  Transportation Needs: No Transportation Needs (02/26/2023)   PRAPARE - Administrator, Civil Service (Medical): No    Lack of Transportation (Non-Medical): No  Physical Activity: Sufficiently Active (02/10/2020)   Exercise Vital Sign    Days of Exercise per Week: 7 days    Minutes of Exercise per Session: 30 min  Stress: No Stress Concern Present (02/10/2020)   [employer] of Occupational Health - Occupational Stress Questionnaire    Feeling of Stress : Not at all  Social Connections: Moderately Integrated (02/26/2023)   Social Connection and Isolation Panel [NHANES]    Frequency of Communication with Friends and Family: More than three times a week    Frequency of Social Gatherings with Friends and Family: Once a week    Attends Religious Services: More than 4 times per year    Active Member of [employer] or Organizations: No    Attends Banker Meetings: Never    Marital Status: Married  Catering Manager Violence: Not At Risk (02/26/2023)   Humiliation, Afraid, Rape, and Kick questionnaire    Fear of Current or Ex-Partner: No    Emotionally Abused: No    Physically Abused: No    Sexually Abused: No    Family History:   Father deceased at age 57 MI Mother post CABG at age 31 MI, still living Younger brother with heart failure Family History  Problem Relation Age of Onset   Coronary artery disease Mother  Rheum arthritis Mother    Coronary artery disease Father    Heart disease Father    Cancer Maternal Grandfather        leukemia   Cancer Paternal Grandmother        breast   Emphysema Other    Cancer Paternal Aunt        breast    ROS:  Please see the history of present illness.  All other ROS reviewed and negative.     Physical Exam/Data:   Vitals:   02/27/23 0809 02/27/23 0836 02/27/23 0914 02/27/23 1000  BP: (!) 149/78   (!) 166/84  Pulse: 63 67   68  Resp: 16 17  20   Temp: 98.4 F (36.9 C)   98.2 F (36.8 C)  TempSrc: Oral   Oral  SpO2: 98% 99% 98%   Weight:      Height:        Intake/Output Summary (Last 24 hours) at 02/27/2023 1123 Last data filed at 02/26/2023 1403 Gross per 24 hour  Intake 360 ml  Output --  Net 360 ml      02/26/2023   11:24 AM 02/25/2023    9:51 PM 02/23/2023    8:30 AM  Last 3 Weights  Weight (lbs) 166 lb 14.2 oz 170 lb 170 lb 8 oz  Weight (kg) 75.7 kg 77.111 kg 77.338 kg     Body mass index is 26.14 kg/m.  General:  Well nourished, well developed, in no acute distress HEENT: normal Neck: no JVD Vascular: Distal pulses 2+ bilaterally Cardiac:  normal S1, S2; RRR; 2/6 systolic murmur at apex  Lungs:  clear to auscultation bilaterally, no wheezing, rhonchi or rales  Abd: soft, nontender, no hepatomegaly  Ext: no edema Musculoskeletal:  No deformities, BUE and BLE strength normal and equal Skin: warm and dry  Neuro:  no focal abnormalities noted Psych:  Normal affect   EKG:  The EKG was personally reviewed and demonstrates:  normal sinus rhythm, LVH Telemetry:  Telemetry was personally reviewed and demonstrates:  normal sinus  Relevant CV Studies: Echocardiogram (02/26/2023) IMPRESSIONS   1. Left ventricular ejection fraction, by estimation, is 60 to 65%. The  left ventricle has normal function. The left ventricle has no regional  wall motion abnormalities. Left ventricular diastolic parameters are  consistent with Grade I diastolic  dysfunction (impaired relaxation).   2. Right ventricular systolic function is normal. The right ventricular  size is normal.   3. Left atrial size was moderately dilated.   4. The mitral valve is normal in structure. Trivial mitral valve  regurgitation. No evidence of mitral stenosis.   5. The aortic valve is tricuspid. Aortic valve regurgitation is mild to  moderate. Mild aortic valve stenosis. Aortic regurgitation PHT measures  468 msec. Aortic  valve area, by VTI measures 1.19 cm. Aortic valve mean  gradient measures 13.5 mmHg. Aortic  valve Vmax measures 2.55 m/s.   6. The inferior vena cava is normal in size with greater than 50%  respiratory variability, suggesting right atrial pressure of 3 mmHg.   FINDINGS   Left Ventricle: Left ventricular ejection fraction, by estimation, is 60  to 65%. The left ventricle has normal function. The left ventricle has no  regional wall motion abnormalities. The left ventricular internal cavity  size was normal in size. There is   no left ventricular hypertrophy. Left ventricular diastolic parameters  are consistent with Grade I diastolic dysfunction (impaired relaxation).  Indeterminate filling pressures.   Right  Ventricle: The right ventricular size is normal. No increase in  right ventricular wall thickness. Right ventricular systolic function is  normal.   Left Atrium: Left atrial size was moderately dilated.   Right Atrium: Right atrial size was normal in size.   Pericardium: There is no evidence of pericardial effusion.   Mitral Valve: The mitral valve is normal in structure. Trivial mitral  valve regurgitation. No evidence of mitral valve stenosis.   Tricuspid Valve: The tricuspid valve is normal in structure. Tricuspid  valve regurgitation is trivial. No evidence of tricuspid stenosis.   Aortic Valve: The aortic valve is tricuspid. Aortic valve regurgitation is  mild to moderate. Aortic regurgitation PHT measures 468 msec. Mild aortic  stenosis is present. Aortic valve mean gradient measures 13.5 mmHg. Aortic  valve peak gradient measures  26.0 mmHg. Aortic valve area, by VTI measures 1.19 cm.   Pulmonic Valve: The pulmonic valve was normal in structure. Pulmonic valve  regurgitation is trivial. No evidence of pulmonic stenosis.   Aorta: The aortic root is normal in size and structure.   Venous: The inferior vena cava is normal in size with greater than 50%   respiratory variability, suggesting right atrial pressure of 3 mmHg.   IAS/Shunts: No atrial level shunt detected by color flow Doppler.   Laboratory Data:  High Sensitivity Troponin:   Recent Labs  Lab 02/17/23 1942 02/17/23 2159 02/25/23 2202 02/26/23 0004  TROPONINIHS 8 8 7 7      Chemistry Recent Labs  Lab 02/23/23 0911 02/25/23 2202 02/26/23 1505 02/27/23 0458  NA 135 131*  --  136  K 4.9 4.3  --  3.8  CL 98 100  --  106  CO2 23 23  --  21*  GLUCOSE 95 111*  --  94  BUN 13 18  --  10  CREATININE 0.84 0.78 0.73 0.67  CALCIUM  10.1 9.2  --  9.2  GFRNONAA  --  >60 >60 >60  ANIONGAP  --  8  --  9    Recent Labs  Lab 02/27/23 0458  PROT 6.3*  ALBUMIN 3.4*  AST 13*  ALT 15  ALKPHOS 54  BILITOT 0.3   Lipids No results for input(s): CHOL, TRIG, HDL, LABVLDL, LDLCALC, CHOLHDL in the last 168 hours.  Hematology Recent Labs  Lab 02/25/23 2236 02/26/23 1505 02/27/23 0458  WBC 9.0 8.7 7.6  RBC 3.94 4.10 3.98  HGB 12.7 13.2 12.7  HCT 36.1 37.9 37.0  MCV 91.6 92.4 93.0  MCH 32.2 32.2 31.9  MCHC 35.2 34.8 34.3  RDW 13.6 13.8 13.8  PLT 403* 458* 408*   Thyroid   Recent Labs  Lab 02/26/23 1505  TSH 0.807    BNP Recent Labs  Lab 02/23/23 0911 02/26/23 1505  BNP 10.2 76.3    DDimer  Recent Labs  Lab 02/26/23 0053  DDIMER 2.05*     Radiology/Studies:  CT Angio Chest Pulmonary Embolism (PE) W or WO Contrast Result Date: 02/27/2023 CLINICAL DATA:  Pulmonary embolism suspected, low to intermediate probability. Positive D-dimer. EXAM: CT ANGIOGRAPHY CHEST WITH CONTRAST TECHNIQUE: Multidetector CT imaging of the chest was performed using the standard protocol during bolus administration of intravenous contrast. Multiplanar CT image reconstructions and MIPs were obtained to evaluate the vascular anatomy. RADIATION DOSE REDUCTION: This exam was performed according to the departmental dose-optimization program which includes automated exposure  control, adjustment of the mA and/or kV according to patient size and/or use of iterative reconstruction technique. CONTRAST:  75mL OMNIPAQUE   IOHEXOL  350 MG/ML SOLN COMPARISON:  02/17/2023 FINDINGS: Cardiovascular: Satisfactory opacification of the pulmonary arteries to the segmental level. No evidence of pulmonary embolism. Normal heart size. No pericardial effusion. Coronary atheromatous calcification. Mild atheromatous wall thickening of the aorta. Mediastinum/Nodes: Negative for mass or adenopathy Lungs/Pleura: Biapical pleural based scarring. Subpleural pulmonary nodules in the right middle lobe and lingula that are essentially stable from 2016, consistent with benign lymph nodes. 3 mm nodule in the lingula on 6:68 and 3 mm nodule the right middle lobe on 6:71. 3 mm nodule not touching pleura in the right middle lobe on 7:234, also stable. 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 Review of the MIP images confirms the above findings. IMPRESSION: 1. Negative for pulmonary embolism or other acute finding. 2. Atherosclerosis including the coronary arteries. Electronically Signed   By: Dorn Roulette M.D.   On: 02/27/2023 04:31   ECHOCARDIOGRAM COMPLETE Result Date: 02/26/2023    ECHOCARDIOGRAM REPORT   Patient Name:   [facility] Date of Exam: 02/26/2023 Medical Rec #:  [id number]           Height:       67.0 in Accession #:    [id number]          Weight:       166.9 lb Date of Birth:  Aug 26, 1960            BSA:          1.873 m Patient Age:    62 years            BP:           162/68 mmHg Patient Gender: F                   HR:           65 bpm. Exam Location:  Inpatient Procedure: 2D Echo, Cardiac Doppler and Color Doppler Indications:    Chest Pain R07.9  History:        Patient has no prior history of Echocardiogram examinations.                 Signs/Symptoms:Chest Pain; Risk Factors:Hypertension, Sleep                  Apnea and Dyslipidemia.  Sonographer:    Thea Norlander RCS Referring Phys: [phone] [name] IMPRESSIONS  1. Left ventricular ejection fraction, by estimation, is 60 to 65%. The left ventricle has normal function. The left ventricle has no regional wall motion abnormalities. Left ventricular diastolic parameters are consistent with Grade I diastolic dysfunction (impaired relaxation).  2. Right ventricular systolic function is normal. The right ventricular size is normal.  3. Left atrial size was moderately dilated.  4. The mitral valve is normal in structure. Trivial mitral valve regurgitation. No evidence of mitral stenosis.  5. The aortic valve is tricuspid. Aortic valve regurgitation is mild to moderate. Mild aortic valve stenosis. Aortic regurgitation PHT measures 468 msec. Aortic valve area, by VTI measures 1.19 cm. Aortic valve mean gradient measures 13.5 mmHg. Aortic valve Vmax measures 2.55 m/s.  6. The inferior vena cava is normal in size with greater than 50% respiratory variability, suggesting right atrial pressure of 3 mmHg. FINDINGS  Left Ventricle: Left ventricular ejection fraction, by estimation, is 60 to 65%. The left ventricle has normal function. The left ventricle has no regional wall motion abnormalities. The left ventricular  internal cavity size was normal in size. There is  no left ventricular hypertrophy. Left ventricular diastolic parameters are consistent with Grade I diastolic dysfunction (impaired relaxation). Indeterminate filling pressures. Right Ventricle: The right ventricular size is normal. No increase in right ventricular wall thickness. Right ventricular systolic function is normal. Left Atrium: Left atrial size was moderately dilated. Right Atrium: Right atrial size was normal in size. Pericardium: There is no evidence of pericardial effusion. Mitral Valve: The mitral valve is normal in structure. Trivial mitral valve regurgitation. No evidence of mitral valve  stenosis. Tricuspid Valve: The tricuspid valve is normal in structure. Tricuspid valve regurgitation is trivial. No evidence of tricuspid stenosis. Aortic Valve: The aortic valve is tricuspid. Aortic valve regurgitation is mild to moderate. Aortic regurgitation PHT measures 468 msec. Mild aortic stenosis is present. Aortic valve mean gradient measures 13.5 mmHg. Aortic valve peak gradient measures 26.0 mmHg. Aortic valve area, by VTI measures 1.19 cm. Pulmonic Valve: The pulmonic valve was normal in structure. Pulmonic valve regurgitation is trivial. No evidence of pulmonic stenosis. Aorta: The aortic root is normal in size and structure. Venous: The inferior vena cava is normal in size with greater than 50% respiratory variability, suggesting right atrial pressure of 3 mmHg. IAS/Shunts: No atrial level shunt detected by color flow Doppler.  LEFT VENTRICLE PLAX 2D LVIDd:         4.10 cm   Diastology LVIDs:         2.80 cm   LV e' medial:    7.46 cm/s LV PW:         2.10 cm   LV E/e' medial:  13.0 LV IVS:        1.20 cm   LV e' lateral:   8.55 cm/s LVOT diam:     1.90 cm   LV E/e' lateral: 11.3 LV SV:         66 LV SV Index:   35 LVOT Area:     2.84 cm  RIGHT VENTRICLE             IVC RV S prime:     15.10 cm/s  IVC diam: 1.80 cm TAPSE (M-mode): 2.2 cm LEFT ATRIUM             Index        RIGHT ATRIUM           Index LA diam:        3.50 cm 1.87 cm/m   RA Area:     14.20 cm LA Vol (A2C):   66.0 ml 35.25 ml/m  RA Volume:   31.60 ml  16.88 ml/m LA Vol (A4C):   61.4 ml 32.79 ml/m LA Biplane Vol: 65.0 ml 34.71 ml/m  AORTIC VALVE AV Area (Vmax):    1.29 cm AV Area (Vmean):   1.25 cm AV Area (VTI):     1.19 cm AV Vmax:           254.75 cm/s AV Vmean:          169.000 cm/s AV VTI:            0.554 m AV Peak Grad:      26.0 mmHg AV Mean Grad:      13.5 mmHg LVOT Vmax:         116.00 cm/s LVOT Vmean:        74.300 cm/s LVOT VTI:          0.232 m LVOT/AV VTI ratio: 0.42 AI PHT:  468 msec  AORTA Ao Root  diam: 3.30 cm Ao Asc diam:  3.30 cm MITRAL VALVE                TRICUSPID VALVE MV Area (PHT): 3.72 cm     TR Peak grad:   21.0 mmHg MV Decel Time: 204 msec     TR Vmax:        229.00 cm/s MR Peak grad: 20.4 mmHg MR Vmax:      226.00 cm/s   SHUNTS MV E velocity: 96.80 cm/s   Systemic VTI:  0.23 m MV A velocity: 126.00 cm/s  Systemic Diam: 1.90 cm MV E/A ratio:  0.77 Annabella Scarce MD Electronically signed by Annabella Scarce MD Signature Date/Time: 02/26/2023/5:40:35 PM    Final    DG Chest Port 1 View Result Date: 02/25/2023 CLINICAL DATA:  Left-sided chest pain for several hours, initial encounter EXAM: PORTABLE CHEST 1 VIEW COMPARISON:  02/17/2023 FINDINGS: Cardiac shadow is within normal limits. Aortic calcifications are noted. Lungs are well aerated bilaterally. No bony abnormality is noted. IMPRESSION: No active disease. Electronically Signed   By: Oneil Devonshire M.D.   On: 02/25/2023 22:44    Assessment and Plan:   Chest pain Negative troponins x2, CTA negative for PE but does show coronary atherosclerosis  EKG remains unchanged from prior, NSR with LVH Positive family history  Patient admits to having SOB, lightheadedness and diaphoresis with these episodes, increasing with exertion  Echocardiogram completed that showed: LVEF of 60 to 65%, no regional wall motion abnormalities, left ventricular diastolic parameters are consistent with grade 1 diastolic dysfunction.  Right ventricular systolic function and  size is normal, left atrium was moderately dilated, trivial mitral regurgitation, mild to moderate aortic valve regurgitation, mild aortic valve stenosis, IVC was normal in size -- Discussed with MD who agrees patient is candidate for cardiac catheterization -- Patient is agreeable to catheterization -- Made NPO  -- Cath scheduled   Hyperlipidemia Patient previously stopped her statins, but is agreeable to try again Last lipid panel (11/24) showed total cholesterol of 293, LDL of  213, HDL of 61 -- Ordered lipoprotein A  -- Start Crestor  10 mg with plans to increase outpatient per MD  Mild to moderate aortic regurgitation Mild aortic stenosis  Previously noted by PCP but no prior echo was ordered to review  Echo from this admission shows moderate aortic regurgitation with mild aortic stenosis with Aortic valve mean gradient measures 13.5 mmHg. Aortic valve peak gradient measures 26.0 mmHg. Aortic valve area, by VTI measures 1.19 cm -- Patient will require serial follow up echos in the outpatient setting  Shortness of breath with exertion BNP (02/26/23) was 76.3 CT angio (12/21, 12/31) were both negative for PE CXR showed no active cardiopulmonary disease  Patient was seen by OT who stated she tolerated treatment well with some decreased tolerance and impaired balance. OT cleared patient for individual ambulation. Patient ambulated from bed to recliner when I was in the room without difficulties    Per primary RUE Cellulitis, resolving Asthma Allergies GERD   Informed Consent   Shared Decision Making/Informed Consent The risks [stroke (1 in 1000), death (1 in 1000), kidney failure [usually temporary] (1 in 500), bleeding (1 in 200), allergic reaction [possibly serious] (1 in 200)], benefits (diagnostic support and management of coronary artery disease) and alternatives of a cardiac catheterization were discussed in detail with Ms. Gamel and she is willing to proceed.     Risk Assessment/Risk Scores:  TIMI Risk Score for Unstable Angina or Non-ST Elevation MI:   The patient's TIMI risk score is 1, which indicates a 5% risk of all cause mortality, new or recurrent myocardial infarction or need for urgent revascularization in the next 14 days.     For questions or updates, please contact [facility] Please consult [email or web address] for contact info under   Signed, Waddell DELENA Donath, PA-C  02/27/2023 11:23 AM

## 2023-02-27 NOTE — Progress Notes (Signed)
 Mobility Specialist Progress Note:    02/27/23 1200  Mobility  Activity Ambulated with assistance in hallway  Level of Assistance Contact guard assist, steadying assist  Assistive Device None  Distance Ambulated (ft) 150 ft  Activity Response Tolerated well  Mobility Referral Yes  Mobility visit 1 Mobility  Mobility Specialist Start Time (ACUTE ONLY) 1031  Mobility Specialist Stop Time (ACUTE ONLY) 1040  Mobility Specialist Time Calculation (min) (ACUTE ONLY) 9 min   Pt received in bed, eager for mobility. C/o some fatigue and chest pain. Took x2 standing rest breaks. Returned to room w/o fault. Pt left on Eob w/ MD present.  D'Vante Nicholaus Mobility Specialist Please contact via Special Educational Needs Teacher or Rehab office at [phone]

## 2023-02-27 NOTE — H&P (View-Only) (Signed)
 62 y/o female w/untreated hyperlipidemia (LDL 213), family history of early CAD, with decreased exercise tolerance, exertional dyspnea, now with chest pain both at rest and exertion.  HD stable, normal physical exam.  Multiple ER visits, normal trops, elevated d-Dime r but no PE on CTA. CTA showed atherosclerosis in coronaries. Echocardiogram with normal EF, grade 1 DD, mod LA dilatation, mild AS. No Afib on EKG or telemetry.   Suspicion for unstable angina, high pre test probability for CAD. Recommend coronary angiography and posssible intervention. She is now amenable to being on statin. Okay to start low dose Crestor  10 mg and uptitrate as tolerated.  If no obstructive CAD found, can consider outpatient pulmonary workup.   Amber JINNY Lawrence, MD

## 2023-02-27 NOTE — Progress Notes (Signed)
 PROGRESS NOTE    Amber Stone  FMW:[id number] DOB: [birth year] DOA: 02/25/2023 PCP: de [place], Raymond J, MD  Chief Complaint  Patient presents with   Chest Pain    Brief Narrative:   Amber Stone is Amber Stone 62 y.o. female with medical history significant of asthma, allergies, depression, GERD, and multiple other medical issues here with progressive malaise, SOB with exertion, weakness, and chest pain.   Cards planning LHC today.   Assessment & Plan:   Active Problems:   Chest pain  Exertional Chest Pain  Concern for Unstable Angina Negative troponins x2.  EKG appears similar to prior.  CT PE protocol negative for PE Echo with EF 60-65%, no RWMA Appreciate cardiology assistance -> planning for catheterization   Shortness of breath with exertion Presyncope  Systolic Murmur   Follow echo -> EF 60-65%, no RWMA, grade 1 diastolic dysfunction - mild aortic stenosis, aortic regurgitation mild to moderate BNP wnl Orthostatics - not done, follow PT/OT  Cards as above   Paresthesias to Bilateral Hands Consider further workup with MRI C spine outpatient +/- neurology follow up Neuro exam reassuring here, mild to no symptoms at this point   Right Upper Extremity Cellulitis She's had 7 days antibiotics, RUE erythema/induration very mild -> will d/c abx and monitor    Asthma Continue home nebs and singulair    Allergies Was previously on allergy shots On several antihistamines at home   GERD PPI    DVT prophylaxis: lovenox  Code Status: full Family Communication: none Disposition:   Status is: Observation The patient remains OBS appropriate and will d/c before 2 midnights.   Consultants:  cardiology  Procedures:  Echo IMPRESSIONS     1. Left ventricular ejection fraction, by estimation, is 60 to 65%. The  left ventricle has normal function. The left ventricle has no regional  wall motion abnormalities. Left ventricular diastolic parameters are   consistent with Grade I diastolic  dysfunction (impaired relaxation).   2. Right ventricular systolic function is normal. The right ventricular  size is normal.   3. Left atrial size was moderately dilated.   4. The mitral valve is normal in structure. Trivial mitral valve  regurgitation. No evidence of mitral stenosis.   5. The aortic valve is tricuspid. Aortic valve regurgitation is mild to  moderate. Mild aortic valve stenosis. Aortic regurgitation PHT measures  468 msec. Aortic valve area, by VTI measures 1.19 cm. Aortic valve mean  gradient measures 13.5 mmHg. Aortic  valve Vmax measures 2.55 m/s.   6. The inferior vena cava is normal in size with greater than 50%  respiratory variability, suggesting right atrial pressure of 3 mmHg.   Antimicrobials:  Anti-infectives (From admission, onward)    None       Subjective: C/o exertional CP  Objective: Vitals:   02/27/23 0809 02/27/23 0836 02/27/23 0914 02/27/23 1000  BP: (!) 149/78   (!) 166/84  Pulse: 63 67  68  Resp: 16 17  20   Temp: 98.4 F (36.9 C)   98.2 F (36.8 C)  TempSrc: Oral   Oral  SpO2: 98% 99% 98%   Weight:      Height:        Intake/Output Summary (Last 24 hours) at 02/27/2023 1241 Last data filed at 02/26/2023 1403 Gross per 24 hour  Intake 360 ml  Output --  Net 360 ml   Filed Weights   02/25/23 2151 02/26/23 1124  Weight: 77.1 kg 75.7 kg    Examination:  General exam: Appears calm and comfortable  Respiratory system: unlabored Cardiovascular system: RRR Gastrointestinal system: Abdomen is nondistended, soft and nontender.  Central nervous system: Alert and oriented. No focal neurological deficits. Extremities: no LEE   Data Reviewed: I have personally reviewed following labs and imaging studies  CBC: Recent Labs  Lab 02/25/23 2236 02/26/23 1505 02/27/23 0458  WBC 9.0 8.7 7.6  HGB 12.7 13.2 12.7  HCT 36.1 37.9 37.0  MCV 91.6 92.4 93.0  PLT 403* 458* 408*    Basic  Metabolic Panel: Recent Labs  Lab 02/23/23 0911 02/25/23 2202 02/26/23 1505 02/27/23 0458  NA 135 131*  --  136  K 4.9 4.3  --  3.8  CL 98 100  --  106  CO2 23 23  --  21*  GLUCOSE 95 111*  --  94  BUN 13 18  --  10  CREATININE 0.84 0.78 0.73 0.67  CALCIUM  10.1 9.2  --  9.2    GFR: Estimated Creatinine Clearance: 76.4 mL/min (by C-G formula based on SCr of 0.67 mg/dL).  Liver Function Tests: Recent Labs  Lab 02/27/23 0458  AST 13*  ALT 15  ALKPHOS 54  BILITOT 0.3  PROT 6.3*  ALBUMIN 3.4*    CBG: No results for input(s): GLUCAP in the last 168 hours.   No results found for this or any previous visit (from the past 240 hours).       Radiology Studies: CT Angio Chest Pulmonary Embolism (PE) W or WO Contrast Result Date: 02/27/2023 CLINICAL DATA:  Pulmonary embolism suspected, low to intermediate probability. Positive D-dimer. EXAM: CT ANGIOGRAPHY CHEST WITH CONTRAST TECHNIQUE: Multidetector CT imaging of the chest was performed using the standard protocol during bolus administration of intravenous contrast. Multiplanar CT image reconstructions and MIPs were obtained to evaluate the vascular anatomy. RADIATION DOSE REDUCTION: This exam was performed according to the departmental dose-optimization program which includes automated exposure control, adjustment of the mA and/or kV according to patient size and/or use of iterative reconstruction technique. CONTRAST:  75mL OMNIPAQUE  IOHEXOL  350 MG/ML SOLN COMPARISON:  02/17/2023 FINDINGS: Cardiovascular: Satisfactory opacification of the pulmonary arteries to the segmental level. No evidence of pulmonary embolism. Normal heart size. No pericardial effusion. Coronary atheromatous calcification. Mild atheromatous wall thickening of the aorta. Mediastinum/Nodes: Negative for mass or adenopathy Lungs/Pleura: Biapical pleural based scarring. Subpleural pulmonary nodules in the right middle lobe and lingula that are essentially stable  from 2016, consistent with benign lymph nodes. 3 mm nodule in the lingula on 6:68 and 3 mm nodule the right middle lobe on 6:71. 3 mm nodule not touching pleura in the right middle lobe on 7:234, also stable. 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 Review of the MIP images confirms the above findings. IMPRESSION: 1. Negative for pulmonary embolism or other acute finding. 2. Atherosclerosis including the coronary arteries. Electronically Signed   By: Dorn Roulette M.D.   On: 02/27/2023 04:31   ECHOCARDIOGRAM COMPLETE Result Date: 02/26/2023    ECHOCARDIOGRAM REPORT   Patient Name:   [facility] Date of Exam: 02/26/2023 Medical Rec #:  [id number]           Height:       67.0 in Accession #:    [id number]          Weight:       166.9 lb Date of Birth:  January 15, 1961  BSA:          1.873 m Patient Age:    62 years            BP:           162/68 mmHg Patient Gender: F                   HR:           65 bpm. Exam Location:  Inpatient Procedure: 2D Echo, Cardiac Doppler and Color Doppler Indications:    Chest Pain R07.9  History:        Patient has no prior history of Echocardiogram examinations.                 Signs/Symptoms:Chest Pain; Risk Factors:Hypertension, Sleep                 Apnea and Dyslipidemia.  Sonographer:    Thea Norlander RCS Referring Phys: [phone] [name] IMPRESSIONS  1. Left ventricular ejection fraction, by estimation, is 60 to 65%. The left ventricle has normal function. The left ventricle has no regional wall motion abnormalities. Left ventricular diastolic parameters are consistent with Grade I diastolic dysfunction (impaired relaxation).  2. Right ventricular systolic function is normal. The right ventricular size is normal.  3. Left atrial size was moderately dilated.  4. The mitral valve is normal in structure. Trivial mitral valve regurgitation. No evidence of mitral  stenosis.  5. The aortic valve is tricuspid. Aortic valve regurgitation is mild to moderate. Mild aortic valve stenosis. Aortic regurgitation PHT measures 468 msec. Aortic valve area, by VTI measures 1.19 cm. Aortic valve mean gradient measures 13.5 mmHg. Aortic valve Vmax measures 2.55 m/s.  6. The inferior vena cava is normal in size with greater than 50% respiratory variability, suggesting right atrial pressure of 3 mmHg. FINDINGS  Left Ventricle: Left ventricular ejection fraction, by estimation, is 60 to 65%. The left ventricle has normal function. The left ventricle has no regional wall motion abnormalities. The left ventricular internal cavity size was normal in size. There is  no left ventricular hypertrophy. Left ventricular diastolic parameters are consistent with Grade I diastolic dysfunction (impaired relaxation). Indeterminate filling pressures. Right Ventricle: The right ventricular size is normal. No increase in right ventricular wall thickness. Right ventricular systolic function is normal. Left Atrium: Left atrial size was moderately dilated. Right Atrium: Right atrial size was normal in size. Pericardium: There is no evidence of pericardial effusion. Mitral Valve: The mitral valve is normal in structure. Trivial mitral valve regurgitation. No evidence of mitral valve stenosis. Tricuspid Valve: The tricuspid valve is normal in structure. Tricuspid valve regurgitation is trivial. No evidence of tricuspid stenosis. Aortic Valve: The aortic valve is tricuspid. Aortic valve regurgitation is mild to moderate. Aortic regurgitation PHT measures 468 msec. Mild aortic stenosis is present. Aortic valve mean gradient measures 13.5 mmHg. Aortic valve peak gradient measures 26.0 mmHg. Aortic valve area, by VTI measures 1.19 cm. Pulmonic Valve: The pulmonic valve was normal in structure. Pulmonic valve regurgitation is trivial. No evidence of pulmonic stenosis. Aorta: The aortic root is normal in size and  structure. Venous: The inferior vena cava is normal in size with greater than 50% respiratory variability, suggesting right atrial pressure of 3 mmHg. IAS/Shunts: No atrial level shunt detected by color flow Doppler.  LEFT VENTRICLE PLAX 2D LVIDd:         4.10 cm   Diastology LVIDs:         2.80  cm   LV e' medial:    7.46 cm/s LV PW:         2.10 cm   LV E/e' medial:  13.0 LV IVS:        1.20 cm   LV e' lateral:   8.55 cm/s LVOT diam:     1.90 cm   LV E/e' lateral: 11.3 LV SV:         66 LV SV Index:   35 LVOT Area:     2.84 cm  RIGHT VENTRICLE             IVC RV S prime:     15.10 cm/s  IVC diam: 1.80 cm TAPSE (M-mode): 2.2 cm LEFT ATRIUM             Index        RIGHT ATRIUM           Index LA diam:        3.50 cm 1.87 cm/m   RA Area:     14.20 cm LA Vol (A2C):   66.0 ml 35.25 ml/m  RA Volume:   31.60 ml  16.88 ml/m LA Vol (A4C):   61.4 ml 32.79 ml/m LA Biplane Vol: 65.0 ml 34.71 ml/m  AORTIC VALVE AV Area (Vmax):    1.29 cm AV Area (Vmean):   1.25 cm AV Area (VTI):     1.19 cm AV Vmax:           254.75 cm/s AV Vmean:          169.000 cm/s AV VTI:            0.554 m AV Peak Grad:      26.0 mmHg AV Mean Grad:      13.5 mmHg LVOT Vmax:         116.00 cm/s LVOT Vmean:        74.300 cm/s LVOT VTI:          0.232 m LVOT/AV VTI ratio: 0.42 AI PHT:            468 msec  AORTA Ao Root diam: 3.30 cm Ao Asc diam:  3.30 cm MITRAL VALVE                TRICUSPID VALVE MV Area (PHT): 3.72 cm     TR Peak grad:   21.0 mmHg MV Decel Time: 204 msec     TR Vmax:        229.00 cm/s MR Peak grad: 20.4 mmHg MR Vmax:      226.00 cm/s   SHUNTS MV E velocity: 96.80 cm/s   Systemic VTI:  0.23 m MV Keela Rubert velocity: 126.00 cm/s  Systemic Diam: 1.90 cm MV E/Arun Herrod ratio:  0.77 Annabella Scarce MD Electronically signed by Annabella Scarce MD Signature Date/Time: 02/26/2023/5:40:35 PM    Final    DG Chest Port 1 View Result Date: 02/25/2023 CLINICAL DATA:  Left-sided chest pain for several hours, initial encounter EXAM: PORTABLE CHEST 1  VIEW COMPARISON:  02/17/2023 FINDINGS: Cardiac shadow is within normal limits. Aortic calcifications are noted. Lungs are well aerated bilaterally. No bony abnormality is noted. IMPRESSION: No active disease. Electronically Signed   By: Oneil Devonshire M.D.   On: 02/25/2023 22:44        Scheduled Meds:  aspirin   81 mg Oral Pre-Cath   enoxaparin  (LOVENOX ) injection  40 mg Subcutaneous Q24H   famotidine   40 mg Oral BID   loratadine   10 mg Oral  Daily   mometasone -formoterol   2 puff Inhalation BID   montelukast   10 mg Oral QHS   pantoprazole   40 mg Oral Daily   rosuvastatin   10 mg Oral Daily   umeclidinium bromide   1 puff Inhalation Daily   Continuous Infusions:  sodium chloride      Followed by   sodium chloride        LOS: 1 day    Time spent: over 30 min    Meliton Monte, MD [facility]   To contact the attending provider between 7A-7P or the covering provider during after hours 7P-7A, please log into the web site [email or web address] and access using universal [place] password for that web site. If you do not have the password, please call the hospital operator.  02/27/2023, 12:41 PM

## 2023-02-27 NOTE — Plan of Care (Signed)
  Problem: Education: Goal: Knowledge of General Education information will improve Description: Including pain rating scale, medication(s)/side effects and non-pharmacologic comfort measures Outcome: Progressing   Problem: Health Behavior/Discharge Planning: Goal: Ability to manage health-related needs will improve Outcome: Progressing   Problem: Clinical Measurements: Goal: Ability to maintain clinical measurements within normal limits will improve Outcome: Progressing Goal: Will remain free from infection Outcome: Progressing Goal: Diagnostic test results will improve Outcome: Progressing Goal: Cardiovascular complication will be avoided Outcome: Progressing   Problem: Activity: Goal: Risk for activity intolerance will decrease Outcome: Progressing   Problem: Nutrition: Goal: Adequate nutrition will be maintained Outcome: Progressing   Problem: Coping: Goal: Level of anxiety will decrease Outcome: Progressing   Problem: Elimination: Goal: Will not experience complications related to bowel motility Outcome: Progressing   Problem: Pain Management: Goal: General experience of comfort will improve Outcome: Progressing   Problem: Safety: Goal: Ability to remain free from injury will improve Outcome: Progressing   Problem: Skin Integrity: Goal: Risk for impaired skin integrity will decrease Outcome: Progressing   Problem: Education: Goal: Understanding of cardiac disease, CV risk reduction, and recovery process will improve Outcome: Progressing Goal: Individualized Educational Video(s) Outcome: Progressing   Problem: Activity: Goal: Ability to tolerate increased activity will improve Outcome: Progressing   Problem: Cardiac: Goal: Ability to achieve and maintain adequate cardiovascular perfusion will improve Outcome: Progressing   Problem: Health Behavior/Discharge Planning: Goal: Ability to safely manage health-related needs after discharge will  improve Outcome: Progressing   Problem: Education: Goal: Understanding of CV disease, CV risk reduction, and recovery process will improve Outcome: Progressing Goal: Individualized Educational Video(s) Outcome: Progressing   Problem: Activity: Goal: Ability to return to baseline activity level will improve Outcome: Progressing   Problem: Cardiovascular: Goal: Ability to achieve and maintain adequate cardiovascular perfusion will improve Outcome: Progressing Goal: Vascular access site(s) Level 0-1 will be maintained Outcome: Progressing   Problem: Health Behavior/Discharge Planning: Goal: Ability to safely manage health-related needs after discharge will improve Outcome: Progressing

## 2023-02-27 NOTE — Progress Notes (Signed)
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 Suspect noncardiac etiology. Recommend Aspirin 81, Crestor (20, not 10), outpatient follow up with [facility] if she prefers (lives near [place]).  Elder Negus, MD

## 2023-02-27 NOTE — Progress Notes (Signed)
 62 y/o female w/untreated hyperlipidemia (LDL 213), family history of early CAD, with decreased exercise tolerance, exertional dyspnea, now with chest pain both at rest and exertion.  HD stable, normal physical exam.  Multiple ER visits, normal trops, elevated d-Dime r but no PE on CTA. CTA showed atherosclerosis in coronaries. Echocardiogram with normal EF, grade 1 DD, mod LA dilatation, mild AS. No Afib on EKG or telemetry.   Suspicion for unstable angina, high pre test probability for CAD. Recommend coronary angiography and posssible intervention. She is now amenable to being on statin. Okay to start low dose Crestor  10 mg and uptitrate as tolerated.  If no obstructive CAD found, can consider outpatient pulmonary workup.   Amber JINNY Lawrence, MD

## 2023-02-27 NOTE — Evaluation (Signed)
 Physical Therapy Evaluation Patient Details Name: Amber Stone MRN: [id number] DOB: Jan 09, 1961 Today's Date: 02/27/2023  History of Present Illness  62 y.o. female admitted 02/25/23 here with progressive malaise, SOB with exertion, weakness, and chest pain.  Troponin negative.  Pt to have cardiac cath later today.  Pt with medical history significant of asthma, allergies, depression, GERD, and multiple other medical issues  Clinical Impression  Pt admitted with above diagnosis. At baseline, pt active and independent but reports decrease in cardiopulmonary endurance past months and now chest pain.  She was able to ambulate 120' with 1 standing rest break with supervision. Pt demonstrated steady balance and has been mobilizing in room.  Will benefit from acute PT to progress.  Pt also interested in post-acute therapy to progress to baseline - reports HHPT would be better than outpt PT as she is unable to leave her spouse with dementia.  Pt currently with functional limitations due to the deficits listed below (see PT Problem List). Pt will benefit from acute skilled PT to increase their independence and safety with mobility to allow discharge.           If plan is discharge home, recommend the following: A little help with walking and/or transfers;Assistance with cooking/housework;A little help with bathing/dressing/bathroom;Help with stairs or ramp for entrance   Can travel by private vehicle        Equipment Recommendations None recommended by PT  Recommendations for Other Services       Functional Status Assessment Patient has had a recent decline in their functional status and demonstrates the ability to make significant improvements in function in a reasonable and predictable amount of time.     Precautions / Restrictions Precautions Precautions: Fall Precaution Comments: Watch HR      Mobility  Bed Mobility Overal bed mobility: Independent                   Transfers Overall transfer level: Independent   Transfers: Sit to/from Stand Sit to Stand: Independent                Ambulation/Gait Ambulation/Gait assistance: Supervision Gait Distance (Feet): 120 Feet Assistive device: None Gait Pattern/deviations: Decreased stride length Gait velocity: decreased     General Gait Details: Has been ambulating in room independently; supervision for hallway; needed 1 standing rest break but no increase in chest pain and HR remained 70-80's.  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    Modified Rankin (Stroke Patients Only)       Balance Overall balance assessment: Needs assistance Sitting-balance support: No upper extremity supported Sitting balance-Leahy Scale: Normal     Standing balance support: No upper extremity supported Standing balance-Leahy Scale: Good                               Pertinent Vitals/Pain Pain Assessment Pain Assessment: Faces Faces Pain Scale: Hurts a little bit Pain Location: chest Pain Descriptors / Indicators: Aching Pain Intervention(s): Limited activity within patient's tolerance, Monitored during session    Home Living Family/patient expects to be discharged to:: Private residence Living Arrangements: Spouse/significant other Available Help at Discharge: Family;Available PRN/intermittently (spouse there but has dementia) Type of Home: House Home Access: Stairs to enter   [employer] of Steps: 2  Home Layout: Laundry or work area in basement;One level Home Equipment: Cane - single point;Shower seat      Prior Function Prior Level of Function : Independent/Modified Independent;Driving             Mobility Comments: Uses no device at baseline, SPC is spouses, active and could ambulate in community (walks dog 6x day); reports gradual decline in endurance last  couple months ADLs Comments: Ind with ADL, iADL, cares for spouse who has dementia (doesn't have to physically assist)     Extremity/Trunk Assessment   Upper Extremity Assessment Upper Extremity Assessment: Overall WFL for tasks assessed    Lower Extremity Assessment Lower Extremity Assessment: Overall WFL for tasks assessed (ROM WFL; MMT 5/5)    Cervical / Trunk Assessment Cervical / Trunk Assessment: Normal  Communication      Cognition Arousal: Alert Behavior During Therapy: WFL for tasks assessed/performed Overall Cognitive Status: Within Functional Limits for tasks assessed                                 General Comments: motivated and pleasant        General Comments      Exercises     Assessment/Plan    PT Assessment Patient needs continued PT services  PT Problem List Cardiopulmonary status limiting activity;Decreased activity tolerance;Decreased knowledge of use of DME;Decreased balance;Decreased mobility       PT Treatment Interventions DME instruction;Therapeutic exercise;Gait training;Balance training;Stair training;Functional mobility training;Therapeutic activities;Patient/family education    PT Goals (Current goals can be found in the Care Plan section)  Acute Rehab PT Goals Patient Stated Goal: return home PT Goal Formulation: With patient Time For Goal Achievement: 03/13/23 Potential to Achieve Goals: Good    Frequency Min 1X/week     Co-evaluation               AM-PAC PT 6 Clicks Mobility  Outcome Measure Help needed turning from your back to your side while in a flat bed without using bedrails?: None Help needed moving from lying on your back to sitting on the side of a flat bed without using bedrails?: None Help needed moving to and from a bed to a chair (including a wheelchair)?: None Help needed standing up from a chair using your arms (e.g., wheelchair or bedside chair)?: None Help needed to walk in hospital  room?: None Help needed climbing 3-5 steps with a railing? : A Little 6 Click Score: 23    End of Session Equipment Utilized During Treatment: Gait belt Activity Tolerance: Patient tolerated treatment well Patient left: in bed;with call bell/phone within reach Nurse Communication: Mobility status PT Visit Diagnosis: Other abnormalities of gait and mobility (R26.89)    Time: 8861-8851 PT Time Calculation (min) (ACUTE ONLY): 10 min   Charges:   PT Evaluation $PT Eval Low Complexity: 1 Low   PT General Charges $$ ACUTE PT VISIT: 1 Visit         Benjiman, PT Acute Rehab Services [facility] [phone]   Benjiman VEAR Mulberry 02/27/2023, 11:57 AM

## 2023-02-27 NOTE — TOC Initial Note (Signed)
 Transition of Care [facility]) - Initial/Assessment Note    Patient Details  Name: Amber Stone MRN: [id number] Date of Birth: 09-06-1960  Transition [facility]) CM/SW Contact:    Sudie Erminio Deems, RN Phone Number: 02/27/2023, 1:59 PM  Clinical Narrative: Patient presented for chest pain- plan for LHC today. PTA patient was from home with spouse. Plan will be to return [facility]. Case Manager called [facility] and they cannot service due to staffing. Case Manager then called [facility] and they can service next week due to the holidays. 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 MD is aware to place orders for [facility] PT/ F2F. No further needs identified at this time.            Expected Discharge Plan: Home w Home Health Services Barriers to Discharge: No Barriers Identified   Patient Goals and CMS Choice Patient states their goals for this hospitalization and ongoing recovery are:: plan for home once stable.   Choice offered to / list presented to : Patient      Expected Discharge Plan and Services In-house Referral: NA Discharge Planning Services: CM Consult Post Acute Care Choice: Home Health Living arrangements for the past 2 months: Single Family Home                   DME Agency: NA       HH Arranged: PT HH Agency: [facility] Date [facility] Agency Contacted: 02/27/23 Time HH Agency Contacted: [phone] Representative spoke with at [facility] Agency: Cindie  Prior Living Arrangements/Services Living arrangements for the past 2 months: Single Family Home Lives with:: Spouse Patient language and need for interpreter reviewed:: Yes Do you feel safe going back to the place where you live?: Yes      Need for Family Participation in Patient Care: Yes (Comment) Care giver support system in place?: Yes (comment)   Criminal Activity/Legal Involvement Pertinent to Current Situation/Hospitalization: No - Comment as  needed  Activities of Daily Living   ADL Screening (condition at time of admission) Independently performs ADLs?: Yes (appropriate for developmental age) Is the patient deaf or have difficulty hearing?: No Does the patient have difficulty seeing, even when wearing glasses/contacts?: No Does the patient have difficulty concentrating, remembering, or making decisions?: No  Permission Sought/Granted Permission sought to share information with : Family Supports, Case Manager Permission granted to share information with : Yes, Verbal Permission Granted     Permission granted to share info w AGENCY: [facility]        Emotional Assessment Appearance:: Appears stated age Attitude/Demeanor/Rapport: Engaged Affect (typically observed): Appropriate Orientation: : Oriented to Self, Oriented to Place, Oriented to  Time, Oriented to Situation Alcohol / Substance Use: Not Applicable Psych Involvement: No (comment)  Admission diagnosis:  Heart murmur [R01.1] Chest pain [R07.9] Postural dizziness with presyncope [R42, R55] Chest pain, unspecified type [R07.9] Patient Active Problem List   Diagnosis Date Noted   Chest pain 02/25/2023   Bruit of right carotid artery 02/23/2023   Cellulitis of right upper extremity 02/23/2023   Wellness examination 02/01/2023   Systolic murmur 07/19/2022   Cervical cancer screening 09/20/2020   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 [place] [address] Phone: [phone] Fax: [phone]     Social Drivers of Health (SDOH) Social History: SDOH Screenings   Food Insecurity: No Food Insecurity (02/26/2023)  Housing: Low Risk  (02/26/2023)  Transportation Needs: No Transportation Needs (02/26/2023)  Utilities: Not At Risk (02/26/2023)  Alcohol Screen: Low Risk  (02/10/2020)  Depression (PHQ2-9): Low Risk  (02/23/2023)  Financial Resource Strain: Low Risk  (02/10/2020)  Physical Activity: Sufficiently Active (02/10/2020)  Social Connections: Moderately Integrated (02/26/2023)  Stress: No Stress Concern Present (02/10/2020)  Tobacco Use: Low Risk  (02/25/2023)  Health Literacy: Adequate Health Literacy (02/23/2023)   SDOH Interventions:     Readmission Risk Interventions     No data to display

## 2023-02-28 DIAGNOSIS — E785 Hyperlipidemia, unspecified: Secondary | ICD-10-CM | POA: Diagnosis not present

## 2023-02-28 DIAGNOSIS — I200 Unstable angina: Secondary | ICD-10-CM | POA: Diagnosis not present

## 2023-02-28 DIAGNOSIS — R079 Chest pain, unspecified: Secondary | ICD-10-CM | POA: Diagnosis not present

## 2023-02-28 DIAGNOSIS — J449 Chronic obstructive pulmonary disease, unspecified: Secondary | ICD-10-CM | POA: Insufficient documentation

## 2023-02-28 MED ORDER — ROSUVASTATIN CALCIUM 10 MG PO TABS: 10.0000 mg | ORAL_TABLET | Freq: Every day | ORAL | 0 refills | Status: DC

## 2023-02-28 MED ORDER — UMECLIDINIUM BROMIDE 62.5 MCG/ACT IN AEPB: 1.0000 | INHALATION_SPRAY | Freq: Every day | RESPIRATORY_TRACT | 0 refills | Status: DC

## 2023-02-28 MED ORDER — ASPIRIN 81 MG PO TBEC: 81.0000 mg | DELAYED_RELEASE_TABLET | Freq: Every day | ORAL | Status: AC

## 2023-02-28 NOTE — TOC Transition Note (Signed)
 Transition of Care [facility]) - Discharge Note   Patient Details  Name: Amber Stone MRN: [id number] Date of Birth: 1960-03-31  [facility]) CM/SW Contact:  Roxie KANDICE Stain, RN Phone Number: 02/28/2023, 9:42 AM   Clinical Narrative:    Patient stable for discharge.  Notified Cindie with [facility] of discharge.  No other TOC needs at this time.   Final next level of care: Home w Home Health Services Barriers to Discharge: Barriers Resolved   Patient Goals and CMS Choice Patient states their goals for this hospitalization and ongoing recovery are:: plan for home once stable. CMS Medicare.gov Compare Post Acute Care list provided to:: Patient Choice offered to / list presented to : Patient      Discharge Placement  home                     Discharge Plan and Services Additional resources added to the After Visit Summary for   In-house Referral: NA Discharge Planning Services: CM Consult Post Acute Care Choice: Home Health            DME Agency: NA       HH Arranged: PT HH Agency: [facility] Date [facility] Agency Contacted: 02/27/23 Time HH Agency Contacted: 1357 Representative spoke with at [facility] Agency: Cindie  Social Drivers of Health (SDOH) Interventions SDOH Screenings   Food Insecurity: No Food Insecurity (02/26/2023)  Housing: Low Risk  (02/26/2023)  Transportation Needs: No Transportation Needs (02/26/2023)  Utilities: Not At Risk (02/26/2023)  Alcohol Screen: Low Risk  (02/10/2020)  Depression (PHQ2-9): Low Risk  (02/23/2023)  Financial Resource Strain: Low Risk  (02/10/2020)  Physical Activity: Sufficiently Active (02/10/2020)  Social Connections: Moderately Integrated (02/26/2023)  Stress: No Stress Concern Present (02/10/2020)  Tobacco Use: Low Risk  (02/25/2023)  Health Literacy: Adequate Health Literacy (02/23/2023)     Readmission Risk Interventions    02/28/2023    9:42 AM  Readmission Risk Prevention Plan  Post  Dischage Appt Complete  Medication Screening Complete  Transportation Screening Complete

## 2023-02-28 NOTE — Progress Notes (Signed)
 Discharge instructions (including medications) discussed with and copy provided to patient/caregiver  Patient was given paper prescriptions to get filled and any pharmacy that is open today.

## 2023-02-28 NOTE — Plan of Care (Signed)
  Problem: Education: Goal: Knowledge of General Education information will improve Description: Including pain rating scale, medication(s)/side effects and non-pharmacologic comfort measures Outcome: Adequate for Discharge   Problem: Health Behavior/Discharge Planning: Goal: Ability to manage health-related needs will improve Outcome: Adequate for Discharge   Problem: Clinical Measurements: Goal: Ability to maintain clinical measurements within normal limits will improve Outcome: Adequate for Discharge Goal: Will remain free from infection Outcome: Adequate for Discharge Goal: Diagnostic test results will improve Outcome: Adequate for Discharge Goal: Cardiovascular complication will be avoided Outcome: Adequate for Discharge   Problem: Activity: Goal: Risk for activity intolerance will decrease Outcome: Adequate for Discharge   Problem: Nutrition: Goal: Adequate nutrition will be maintained Outcome: Adequate for Discharge   Problem: Coping: Goal: Level of anxiety will decrease Outcome: Adequate for Discharge   Problem: Elimination: Goal: Will not experience complications related to bowel motility Outcome: Adequate for Discharge   Problem: Pain Management: Goal: General experience of comfort will improve Outcome: Adequate for Discharge   Problem: Safety: Goal: Ability to remain free from injury will improve Outcome: Adequate for Discharge   Problem: Skin Integrity: Goal: Risk for impaired skin integrity will decrease Outcome: Adequate for Discharge   Problem: Education: Goal: Understanding of cardiac disease, CV risk reduction, and recovery process will improve Outcome: Adequate for Discharge Goal: Individualized Educational Video(s) Outcome: Adequate for Discharge   Problem: Activity: Goal: Ability to tolerate increased activity will improve Outcome: Adequate for Discharge   Problem: Cardiac: Goal: Ability to achieve and maintain adequate cardiovascular  perfusion will improve Outcome: Adequate for Discharge   Problem: Health Behavior/Discharge Planning: Goal: Ability to safely manage health-related needs after discharge will improve Outcome: Adequate for Discharge   Problem: Acute Rehab OT Goals (only OT should resolve) Goal: Pt. Will Perform Grooming Outcome: Adequate for Discharge Goal: Pt. Will Perform Lower Body Dressing Outcome: Adequate for Discharge Goal: Pt. Will Transfer To Toilet Outcome: Adequate for Discharge   Problem: Acute Rehab PT Goals(only PT should resolve) Goal: Pt Will Ambulate Outcome: Adequate for Discharge Goal: Pt Will Go Up/Down Stairs Outcome: Adequate for Discharge   Problem: Education: Goal: Understanding of CV disease, CV risk reduction, and recovery process will improve Outcome: Adequate for Discharge Goal: Individualized Educational Video(s) Outcome: Adequate for Discharge   Problem: Activity: Goal: Ability to return to baseline activity level will improve Outcome: Adequate for Discharge   Problem: Cardiovascular: Goal: Ability to achieve and maintain adequate cardiovascular perfusion will improve Outcome: Adequate for Discharge Goal: Vascular access site(s) Level 0-1 will be maintained Outcome: Adequate for Discharge   Problem: Health Behavior/Discharge Planning: Goal: Ability to safely manage health-related needs after discharge will improve Outcome: Adequate for Discharge

## 2023-02-28 NOTE — Discharge Summary (Signed)
 Physician Discharge Summary  Amber Stone FMW:[id number] DOB: 24-Jul-1960 DOA: 02/25/2023  PCP: de Cuba, Raymond J, MD  Admit date: 02/25/2023 Discharge date: 02/28/2023 30 Day Unplanned Readmission Risk Score    Flowsheet Row ED to Hosp-Admission (Current) from 02/25/2023 in [facility]  30 Day Unplanned Readmission Risk Score (%) 8.2 Filed at 02/26/2023 1200       This score is the patient's risk of an unplanned readmission within 30 days of being discharged (0 -100%). The score is based on dignosis, age, lab data, medications, orders, and past utilization.   Low:  0-14.9   Medium: 15-21.9   High: 22-29.9   Extreme: 30 and above          Admitted From: Home Disposition: Home  Recommendations for Outpatient Follow-up:  Follow up with PCP in 1-2 weeks Please obtain BMP/CBC in one week Follow-up with your cardiologist, she says that she has an appointment in second week of February to see her primary cardiologist Please follow up with your PCP on the following pending results: Unresulted Labs (From admission, onward)     Start     Ordered   03/05/23 0500  Creatinine, serum  (enoxaparin  (LOVENOX )    CrCl >/= 30 ml/min)  Weekly,   R     Comments: while on enoxaparin  therapy    02/26/23 1306   02/27/23 1120  Lipoprotein A (LPA)  Once,   R        02/27/23 1120   02/26/23 1307  Urinalysis, Routine w reflex microscopic -Urine, Clean Catch  Once,   R       Question:  Specimen Source  Answer:  Urine, Clean Catch   02/26/23 1306              Home Health: Yes Equipment/Devices: None  Discharge Condition: Stable CODE STATUS: Full code  Diet recommendation: Cardiac  Subjective: Seen and examined, she feels well with no shortness of breath and she tells me that she has chronic neck pain which actually has resolved since she had anesthesia yesterday.  She says that she is feeling much better with the inhaler that she has been getting in the hospital and  she is requesting to prescribe that.  Brief/Interim Summary: Amber Stone is a 63 y.o. female with medical history significant of asthma, allergies, depression, GERD, and multiple other medical issues presented with progressive malaise, SOB with exertion, weakness, and chest pain.  Details below.   Exertional Chest Pain  Concern for Unstable Angina Negative troponins x2.  EKG appears similar to prior.  CT PE protocol negative for PE Echo with EF 60-65%, no RWMA.  Underwent cardiac cath and was found to have mild nonobstructive CAD with mild ostial LAD stenosis, mild proximal LAD stenosis and patency of the left circumflex and right RCA without significant stenosis, cardiology recommended aggressive medical therapy and aggressive lipid-lowering therapy and cleared her for discharge.  She has follow-up to see her cardiologist in 6 weeks.  Shortness of breath with exertion/presumed underlying COPD Presyncope  Systolic Murmur   Follow echo -> EF 60-65%, no RWMA, grade 1 diastolic dysfunction - mild aortic stenosis, aortic regurgitation mild to moderate BNP wnl.  Seen by PT OT, they recommended home health.  She says that she has been feeling much better since she has been given Incruse Ellipta  in the ED and she is requesting to prescribe that and alert, I have advised her to see pulmonary critical care to have official  pulmonary function test to rule out COPD as she says that she has been had exposed to passive smoking all her life.  I suspect she has underlying COPD.   Paresthesias to Bilateral Hands Improved since she had anesthesia yesterday.  Likely musculoskeletal.  May follow-up outpatient with neurosurgery and have CT or MRI as outpatient if symptoms recur.   Right Upper Extremity Cellulitis She's had 7 days antibiotics, RUE erythema/induration resolved.  Antibiotics discontinued. Asthma Continue home nebs and singulair    Allergies Was previously on allergy shots On several  antihistamines at home   GERD PPI  Discharge plan was discussed with patient and/or family member and they verbalized understanding and agreed with it.  Discharge Diagnoses:  Active Problems:   Asthma   Hyperlipidemia   Chest pain   Unstable angina (HCC)   COPD (chronic obstructive pulmonary disease) (HCC)    Discharge Instructions   Allergies as of 02/28/2023       Reactions   Egg-derived Products    Other    Clams, hazelnuts., Chemical sensitivities, many fruits, veggies, grass, trees        Medication List     TAKE these medications    albuterol  108 (90 Base) MCG/ACT inhaler Commonly known as: VENTOLIN  HFA Inhale 2 puffs into the lungs every 4 (four) hours as needed for wheezing or shortness of breath.   aspirin  EC 81 MG tablet Take 1 tablet (81 mg total) by mouth daily. Swallow whole.   budesonide -formoterol  160-4.5 MCG/ACT inhaler Commonly known as: SYMBICORT  Inhale 2 puffs into the lungs 2 (two) times daily. What changed: when to take this   cetirizine 10 MG tablet Commonly known as: ZYRTEC Take 10 mg by mouth daily.   clemastine 2.68 MG Tabs tablet Commonly known as: TAVIST Take 2.68 mg by mouth 2 (two) times daily. Take 2.68mg  (1 tablet) by mouth every morning and 6.68-5.36mg  (1-2 tablets) at night.   clindamycin 300 MG capsule Commonly known as: CLEOCIN Take 300 mg by mouth 3 (three) times daily.   famotidine  40 MG tablet Commonly known as: PEPCID  Take 40 mg by mouth 2 (two) times daily.   hydrOXYzine  25 MG tablet Commonly known as: ATARAX  Take 25 mg by mouth at bedtime.   ipratropium-albuterol  0.5-2.5 (3) MG/3ML Soln Commonly known as: DUONEB Inhale 3 mLs into the lungs every 6 (six) hours as needed (shortness of breath wheezing).   montelukast  10 MG tablet Commonly known as: SINGULAIR  Take 10 mg by mouth in the morning.   multivitamin capsule Take 1 capsule by mouth daily.   omeprazole  40 MG capsule Commonly known as:  PRILOSEC TAKE ONE CAPSULE BY MOUTH DAILY   rosuvastatin  10 MG tablet Commonly known as: CRESTOR  Take 1 tablet (10 mg total) by mouth daily.   tiotropium 18 MCG inhalation capsule Commonly known as: SPIRIVA  Place 18 mcg into inhaler and inhale daily.   umeclidinium bromide  62.5 MCG/ACT Aepb Commonly known as: INCRUSE ELLIPTA  Inhale 1 puff into the lungs daily.   Vitamin D-3 125 MCG (5000 UT) Tabs Take 1 tablet by mouth daily.   XIIDRA OP Place 1 drop into both eyes 2 (two) times daily.        Follow-up Information     [facility] Follow up.   Specialty: Home Health Services Why: Physical Therapy-office to call with visit times. Contact information: [address] [place] [address] [phone]         de Cuba, Quintin PARAS, MD Follow up  in 1 week(s).   Specialty: Family Medicine Contact information: [address] [place] [address] [phone]                Allergies  Allergen Reactions   Egg-Derived Products    Other     Clams, hazelnuts., Chemical sensitivities, many fruits, veggies, grass, trees    Consultations: Cardiology   Procedures/Studies: CARDIAC CATHETERIZATION Result Date: 02/27/2023 1.  Mild nonobstructive CAD with mild ostial LAD stenosis, mild proximal LAD stenosis, and patency of the left circumflex and RCA without significant stenoses 2.  Normal/low LVEDP Recommendations: Medical therapy, aggressive lipid-lowering as tolerated.   CT Angio Chest Pulmonary Embolism (PE) W or WO Contrast Result Date: 02/27/2023 CLINICAL DATA:  Pulmonary embolism suspected, low to intermediate probability. Positive D-dimer. EXAM: CT ANGIOGRAPHY CHEST WITH CONTRAST TECHNIQUE: Multidetector CT imaging of the chest was performed using the standard protocol during bolus administration of intravenous contrast. Multiplanar CT image reconstructions and MIPs were obtained to evaluate the vascular anatomy. RADIATION DOSE  REDUCTION: This exam was performed according to the departmental dose-optimization program which includes automated exposure control, adjustment of the mA and/or kV according to patient size and/or use of iterative reconstruction technique. CONTRAST:  75mL OMNIPAQUE  IOHEXOL  350 MG/ML SOLN COMPARISON:  02/17/2023 FINDINGS: Cardiovascular: Satisfactory opacification of the pulmonary arteries to the segmental level. No evidence of pulmonary embolism. Normal heart size. No pericardial effusion. Coronary atheromatous calcification. Mild atheromatous wall thickening of the aorta. Mediastinum/Nodes: Negative for mass or adenopathy Lungs/Pleura: Biapical pleural based scarring. Subpleural pulmonary nodules in the right middle lobe and lingula that are essentially stable from 2016, consistent with benign lymph nodes. 3 mm nodule in the lingula on 6:68 and 3 mm nodule the right middle lobe on 6:71. 3 mm nodule not touching pleura in the right middle lobe on 7:234, also stable. 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 Review of the MIP images confirms the above findings. IMPRESSION: 1. Negative for pulmonary embolism or other acute finding. 2. Atherosclerosis including the coronary arteries. Electronically Signed   By: Dorn Roulette M.D.   On: 02/27/2023 04:31   ECHOCARDIOGRAM COMPLETE Result Date: 02/26/2023    ECHOCARDIOGRAM REPORT   Patient Name:   [facility] Date of Exam: 02/26/2023 Medical Rec #:  [id number]           Height:       67.0 in Accession #:    [id number]          Weight:       166.9 lb Date of Birth:  10/21/1960            BSA:          1.873 m Patient Age:    62 years            BP:           162/68 mmHg Patient Gender: F                   HR:           65 bpm. Exam Location:  Inpatient Procedure: 2D Echo, Cardiac Doppler and Color Doppler Indications:    Chest Pain R07.9  History:        Patient has  no prior history of Echocardiogram examinations.                 Signs/Symptoms:Chest Pain; Risk Factors:Hypertension, Sleep  Apnea and Dyslipidemia.  Sonographer:    Thea Norlander RCS Referring Phys: [phone] [name] IMPRESSIONS  1. Left ventricular ejection fraction, by estimation, is 60 to 65%. The left ventricle has normal function. The left ventricle has no regional wall motion abnormalities. Left ventricular diastolic parameters are consistent with Grade I diastolic dysfunction (impaired relaxation).  2. Right ventricular systolic function is normal. The right ventricular size is normal.  3. Left atrial size was moderately dilated.  4. The mitral valve is normal in structure. Trivial mitral valve regurgitation. No evidence of mitral stenosis.  5. The aortic valve is tricuspid. Aortic valve regurgitation is mild to moderate. Mild aortic valve stenosis. Aortic regurgitation PHT measures 468 msec. Aortic valve area, by VTI measures 1.19 cm. Aortic valve mean gradient measures 13.5 mmHg. Aortic valve Vmax measures 2.55 m/s.  6. The inferior vena cava is normal in size with greater than 50% respiratory variability, suggesting right atrial pressure of 3 mmHg. FINDINGS  Left Ventricle: Left ventricular ejection fraction, by estimation, is 60 to 65%. The left ventricle has normal function. The left ventricle has no regional wall motion abnormalities. The left ventricular internal cavity size was normal in size. There is  no left ventricular hypertrophy. Left ventricular diastolic parameters are consistent with Grade I diastolic dysfunction (impaired relaxation). Indeterminate filling pressures. Right Ventricle: The right ventricular size is normal. No increase in right ventricular wall thickness. Right ventricular systolic function is normal. Left Atrium: Left atrial size was moderately dilated. Right Atrium: Right atrial size was normal in size. Pericardium: There is no evidence of pericardial  effusion. Mitral Valve: The mitral valve is normal in structure. Trivial mitral valve regurgitation. No evidence of mitral valve stenosis. Tricuspid Valve: The tricuspid valve is normal in structure. Tricuspid valve regurgitation is trivial. No evidence of tricuspid stenosis. Aortic Valve: The aortic valve is tricuspid. Aortic valve regurgitation is mild to moderate. Aortic regurgitation PHT measures 468 msec. Mild aortic stenosis is present. Aortic valve mean gradient measures 13.5 mmHg. Aortic valve peak gradient measures 26.0 mmHg. Aortic valve area, by VTI measures 1.19 cm. Pulmonic Valve: The pulmonic valve was normal in structure. Pulmonic valve regurgitation is trivial. No evidence of pulmonic stenosis. Aorta: The aortic root is normal in size and structure. Venous: The inferior vena cava is normal in size with greater than 50% respiratory variability, suggesting right atrial pressure of 3 mmHg. IAS/Shunts: No atrial level shunt detected by color flow Doppler.  LEFT VENTRICLE PLAX 2D LVIDd:         4.10 cm   Diastology LVIDs:         2.80 cm   LV e' medial:    7.46 cm/s LV PW:         2.10 cm   LV E/e' medial:  13.0 LV IVS:        1.20 cm   LV e' lateral:   8.55 cm/s LVOT diam:     1.90 cm   LV E/e' lateral: 11.3 LV SV:         66 LV SV Index:   35 LVOT Area:     2.84 cm  RIGHT VENTRICLE             IVC RV S prime:     15.10 cm/s  IVC diam: 1.80 cm TAPSE (M-mode): 2.2 cm LEFT ATRIUM             Index        RIGHT ATRIUM  Index LA diam:        3.50 cm 1.87 cm/m   RA Area:     14.20 cm LA Vol (A2C):   66.0 ml 35.25 ml/m  RA Volume:   31.60 ml  16.88 ml/m LA Vol (A4C):   61.4 ml 32.79 ml/m LA Biplane Vol: 65.0 ml 34.71 ml/m  AORTIC VALVE AV Area (Vmax):    1.29 cm AV Area (Vmean):   1.25 cm AV Area (VTI):     1.19 cm AV Vmax:           254.75 cm/s AV Vmean:          169.000 cm/s AV VTI:            0.554 m AV Peak Grad:      26.0 mmHg AV Mean Grad:      13.5 mmHg LVOT Vmax:         116.00 cm/s  LVOT Vmean:        74.300 cm/s LVOT VTI:          0.232 m LVOT/AV VTI ratio: 0.42 AI PHT:            468 msec  AORTA Ao Root diam: 3.30 cm Ao Asc diam:  3.30 cm MITRAL VALVE                TRICUSPID VALVE MV Area (PHT): 3.72 cm     TR Peak grad:   21.0 mmHg MV Decel Time: 204 msec     TR Vmax:        229.00 cm/s MR Peak grad: 20.4 mmHg MR Vmax:      226.00 cm/s   SHUNTS MV E velocity: 96.80 cm/s   Systemic VTI:  0.23 m MV A velocity: 126.00 cm/s  Systemic Diam: 1.90 cm MV E/A ratio:  0.77 Annabella Scarce MD Electronically signed by Annabella Scarce MD Signature Date/Time: 02/26/2023/5:40:35 PM    Final    DG Chest Port 1 View Result Date: 02/25/2023 CLINICAL DATA:  Left-sided chest pain for several hours, initial encounter EXAM: PORTABLE CHEST 1 VIEW COMPARISON:  02/17/2023 FINDINGS: Cardiac shadow is within normal limits. Aortic calcifications are noted. Lungs are well aerated bilaterally. No bony abnormality is noted. IMPRESSION: No active disease. Electronically Signed   By: Oneil Devonshire M.D.   On: 02/25/2023 22:44   US  Venous Img Upper Right (DVT Study) Result Date: 02/20/2023 CLINICAL DATA:  Right upper extremity pain and swelling EXAM: RIGHT UPPER EXTREMITY VENOUS DOPPLER ULTRASOUND TECHNIQUE: Gray-scale sonography with graded compression, as well as color Doppler and duplex ultrasound were performed to evaluate the upper extremity deep venous system from the level of the subclavian vein and including the jugular, axillary, basilic, radial, ulnar and upper cephalic vein. Spectral Doppler was utilized to evaluate flow at rest and with distal augmentation maneuvers. COMPARISON:  None Available. FINDINGS: Contralateral Subclavian Vein: Respiratory phasicity is normal and symmetric with the symptomatic side. No evidence of thrombus. Normal compressibility. Internal Jugular Vein: No evidence of thrombus. Normal compressibility, respiratory phasicity and response to augmentation. Subclavian Vein: No  evidence of thrombus. Normal compressibility, respiratory phasicity and response to augmentation. Axillary Vein: No evidence of thrombus. Normal compressibility, respiratory phasicity and response to augmentation. Cephalic Vein: No evidence of thrombus. Normal compressibility, respiratory phasicity and response to augmentation. Basilic Vein: No evidence of thrombus. Normal compressibility, respiratory phasicity and response to augmentation. Brachial Veins: No evidence of thrombus. Normal compressibility, respiratory phasicity and response to augmentation. Radial Veins: No  evidence of thrombus. Normal compressibility, respiratory phasicity and response to augmentation. Ulnar Veins: No evidence of thrombus. Normal compressibility, respiratory phasicity and response to augmentation. IMPRESSION: No evidence of DVT within the right upper extremity. Electronically Signed   By: CHRISTELLA.  Shick M.D.   On: 02/20/2023 12:27   CT Angio Chest PE W and/or Wo Contrast Result Date: 02/17/2023 CLINICAL DATA:  Shortness of breath on exertion with left arm and left shoulder pain. EXAM: CT ANGIOGRAPHY CHEST WITH CONTRAST TECHNIQUE: Multidetector CT imaging of the chest was performed using the standard protocol during bolus administration of intravenous contrast. Multiplanar CT image reconstructions and MIPs were obtained to evaluate the vascular anatomy. RADIATION DOSE REDUCTION: This exam was performed according to the departmental dose-optimization program which includes automated exposure control, adjustment of the mA and/or kV according to patient size and/or use of iterative reconstruction technique. CONTRAST:  75mL OMNIPAQUE  IOHEXOL  350 MG/ML SOLN COMPARISON:  March 17, 2014 FINDINGS: Cardiovascular: There is mild to moderate severity calcification of the aortic arch, without evidence of aortic aneurysm. Satisfactory opacification of the pulmonary arteries to the segmental level. No evidence of pulmonary embolism. Normal heart  size. No pericardial effusion. Mediastinum/Nodes: No enlarged mediastinal, hilar, or axillary lymph nodes. Thyroid  gland, trachea, and esophagus demonstrate no significant findings. Lungs/Pleura: Mild biapical scarring and/or atelectasis is noted, right slightly greater than left. Predominantly stable 3 mm lung nodules are seen within the right middle lobe (axial CT images 79 and 92, CT series 6). No pleural effusion or pneumothorax is identified. Upper Abdomen: Surgical clips are seen within the gallbladder fossa. A 2.6 cm x 1.7 cm x 3.0 cm low-attenuation (approximately 4.25 Hounsfield units) left adrenal mass is noted. A 1.7 cm diameter exophytic cystic appearing lesion is seen along the lateral aspect of the mid to upper right kidney. Musculoskeletal: A chronic appearing deformity of the sternum is seen. No acute or significant osseous findings. Review of the MIP images confirms the above findings. IMPRESSION: 1. No evidence of pulmonary embolism or other acute intrathoracic process. 2. Predominantly stable, likely benign 3 mm right middle lobe lung nodules. 3. Evidence of prior cholecystectomy. 4. 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 No follow-up imaging is recommended. This recommendation follows ACR consensus guidelines: Management of Incidental Adrenal Masses: A White Paper of the ACR Incidental Findings Committee. J Am Coll Radiol 2017;14:1038-1044. 5. Aortic atherosclerosis. Aortic Atherosclerosis (ICD10-I70.0). Electronically Signed   By: Suzen Dials M.D.   On: 02/17/2023 21:38   DG Chest 2 View Result Date: 02/17/2023 CLINICAL DATA:  Chest pain EXAM: CHEST - 2 VIEW COMPARISON:  02/17/2014 FINDINGS: The lungs are clear without focal pneumonia, edema, pneumothorax or pleural effusion. The cardiopericardial silhouette is within normal limits for size. No acute bony abnormality. IMPRESSION: No active cardiopulmonary disease. Electronically Signed    By: Camellia Candle M.D.   On: 02/17/2023 19:44     Discharge Exam: Vitals:   02/28/23 0400 02/28/23 0758  BP:  (!) 143/64  Pulse:  73  Resp:  18  Temp:  98.2 F (36.8 C)  SpO2: 96% 98%   Vitals:   02/28/23 0017 02/28/23 0349 02/28/23 0400 02/28/23 0758  BP: 138/61 122/72  (!) 143/64  Pulse: 68 73  73  Resp: 18 16  18   Temp: 97.8 F (36.6 C) 98 F (36.7 C)  98.2 F (36.8 C)  TempSrc: Oral Oral  Oral  SpO2: 97% 98% 96% 98%  Weight:      Height:  General: Pt is alert, awake, not in acute distress Cardiovascular: RRR, S1/S2 +, no rubs, no gallops Respiratory: CTA bilaterally, no wheezing, no rhonchi Abdominal: Soft, NT, ND, bowel sounds + Extremities: no edema, no cyanosis    The results of significant diagnostics from this hospitalization (including imaging, microbiology, ancillary and laboratory) are listed below for reference.     Microbiology: No results found for this or any previous visit (from the past 240 hours).   Labs: BNP (last 3 results) Recent Labs    02/23/23 0911 02/26/23 1505  BNP 10.2 76.3   Basic Metabolic Panel: Recent Labs  Lab 02/23/23 0911 02/25/23 2202 02/26/23 1505 02/27/23 0458  NA 135 131*  --  136  K 4.9 4.3  --  3.8  CL 98 100  --  106  CO2 23 23  --  21*  GLUCOSE 95 111*  --  94  BUN 13 18  --  10  CREATININE 0.84 0.78 0.73 0.67  CALCIUM  10.1 9.2  --  9.2   Liver Function Tests: Recent Labs  Lab 02/27/23 0458  AST 13*  ALT 15  ALKPHOS 54  BILITOT 0.3  PROT 6.3*  ALBUMIN 3.4*   No results for input(s): LIPASE, AMYLASE in the last 168 hours. No results for input(s): AMMONIA in the last 168 hours. CBC: Recent Labs  Lab 02/25/23 2236 02/26/23 1505 02/27/23 0458  WBC 9.0 8.7 7.6  HGB 12.7 13.2 12.7  HCT 36.1 37.9 37.0  MCV 91.6 92.4 93.0  PLT 403* 458* 408*   Cardiac Enzymes: No results for input(s): CKTOTAL, CKMB, CKMBINDEX, TROPONINI in the last 168 hours. BNP: Invalid input(s):  POCBNP CBG: No results for input(s): GLUCAP in the last 168 hours. D-Dimer Recent Labs    02/26/23 0053  DDIMER 2.05*   Hgb A1c No results for input(s): HGBA1C in the last 72 hours. Lipid Profile No results for input(s): CHOL, HDL, LDLCALC, TRIG, CHOLHDL, LDLDIRECT in the last 72 hours. Thyroid  function studies Recent Labs    02/26/23 1505  TSH 0.807   Anemia work up No results for input(s): VITAMINB12, FOLATE, FERRITIN, TIBC, IRON, RETICCTPCT in the last 72 hours. Urinalysis    Component Value Date/Time   COLORURINE YELLOW 12/22/2008 0917   APPEARANCEUR CLEAR 12/22/2008 0917   LABSPEC 1.006 12/22/2008 0917   PHURINE 6.0 12/22/2008 0917   GLUCOSEU NEGATIVE 12/22/2008 0917   HGBUR TRACE (A) 12/22/2008 0917   BILIRUBINUR NEGATIVE 12/22/2008 0917   KETONESUR NEGATIVE 12/22/2008 0917   PROTEINUR NEGATIVE 12/22/2008 0917   UROBILINOGEN 0.2 12/22/2008 0917   NITRITE NEGATIVE 12/22/2008 0917   LEUKOCYTESUR NEGATIVE 12/22/2008 0917   Sepsis Labs Recent Labs  Lab 02/25/23 2236 02/26/23 1505 02/27/23 0458  WBC 9.0 8.7 7.6   Microbiology No results found for this or any previous visit (from the past 240 hours).  FURTHER DISCHARGE INSTRUCTIONS:   Get Medicines reviewed and adjusted: Please take all your medications with you for your next visit with your Primary MD   Laboratory/radiological data: Please request your Primary MD to go over all hospital tests and procedure/radiological results at the follow up, please ask your Primary MD to get all Hospital records sent to his/her office.   In some cases, they will be blood work, cultures and biopsy results pending at the time of your discharge. Please request that your primary care M.D. goes through all the records of your hospital data and follows up on these results.   Also Note the following: If  you experience worsening of your admission symptoms, develop shortness of breath, life  threatening emergency, suicidal or homicidal thoughts you must seek medical attention immediately by calling 911 or calling your MD immediately  if symptoms less severe.   You must read complete instructions/literature along with all the possible adverse reactions/side effects for all the Medicines you take and that have been prescribed to you. Take any new Medicines after you have completely understood and accpet all the possible adverse reactions/side effects.    Do not drive when taking Pain medications or sleeping medications (Benzodaizepines)   Do not take more than prescribed Pain, Sleep and Anxiety Medications. It is not advisable to combine anxiety,sleep and pain medications without talking with your primary care practitioner   Special Instructions: If you have smoked or chewed Tobacco  in the last 2 yrs please stop smoking, stop any regular Alcohol  and or any Recreational drug use.   Wear Seat belts while driving.   Please note: You were cared for by a hospitalist during your hospital stay. Once you are discharged, your primary care physician will handle any further medical issues. Please note that NO REFILLS for any discharge medications will be authorized once you are discharged, as it is imperative that you return to your primary care physician (or establish a relationship with a primary care physician if you do not have one) for your post hospital discharge needs so that they can reassess your need for medications and monitor your lab values  Time coordinating discharge: Over 30 minutes  SIGNED:   Fredia Skeeter, MD  [facility] 02/28/2023, 9:21 AM *Please note that this is a verbal dictation therefore any spelling or grammatical errors are due to the Dragon Medical One system interpretation. If 7PM-7AM, please contact night-coverage [email or web address]

## 2023-03-01 ENCOUNTER — Encounter (HOSPITAL_COMMUNITY): Payer: Self-pay | Admitting: Cardiovascular Disease

## 2023-03-01 ENCOUNTER — Telehealth (HOSPITAL_BASED_OUTPATIENT_CLINIC_OR_DEPARTMENT_OTHER): Payer: Self-pay | Admitting: *Deleted

## 2023-03-01 ENCOUNTER — Telehealth (HOSPITAL_BASED_OUTPATIENT_CLINIC_OR_DEPARTMENT_OTHER): Payer: Self-pay | Admitting: Family Medicine

## 2023-03-01 LAB — LIPOPROTEIN A (LPA): Lipoprotein (a): 306.5 nmol/L — ABNORMAL HIGH (ref <75.0)

## 2023-03-01 NOTE — Telephone Encounter (Signed)
 Called pt back went to see if pulmonary had samples they did not. Called pt to see if the pulmonary she was seeing could provide samples she stated he is out of [facility] and he is out of the office until next week. Advised that we would have a hard time getting this approved without seeing her. Let her know that I would try to initiate a prior authorization but there was no guarantee that we could without seeing her. She does have new insurance. We do not have a copy of the card.

## 2023-03-01 NOTE — Telephone Encounter (Signed)
 Amber Stone

## 2023-03-01 NOTE — Telephone Encounter (Signed)
 Copied from CRM [id number]. Topic: Appointments - Scheduling Inquiry for Clinic >> Mar 01, 2023  1:30 PM Nestora J wrote: Reason for CRM: Pt needs a hospital follow up and a prescription refill but no avail. Appointments until 01/28. Please follow up with pt [phone]  Reason for CRM: Pt needs a hospital follow up and a prescription refill but no avail. Appointments until 01/28. Please follow up with pt [phone]

## 2023-03-01 NOTE — Telephone Encounter (Signed)
 Transition Care Management Follow-up Telephone Call   Date discharged?     02/27/22            How have you been since you were released from the hospital?  been doing okay   Do you understand why you were in the hospital? yes   Do you understand the discharge instructions? yes   Where were you discharged to? home     Items Reviewed: Medications reviewed: yes  Allergies reviewed: yes Dietary changes reviewed: yes Referrals reviewed: yes     Functional Questionnaire:  Activities of Daily Living (ADLs):  I    Any transportation issues/concerns?: N   Any patient concerns? needs ellipta refilled but sees pulmonolgy Thursday next week   Confirmed importance and date/time of follow-up visits scheduled Y     Confirmed with patient if condition begins to worsen call PCP or go to the ER.  Patient was given the office number and encouraged to call back with question or concerns.  :

## 2023-03-01 NOTE — Telephone Encounter (Signed)
 FYI  Called pt to schedule hospital follow up was put on ellipta from hospital but has not been able to get on medication because this needs a prior authorization. Pt sees pulmonary 1/9. Soonest appt you have 1/8. Let her know we would see what we could do.

## 2023-03-06 ENCOUNTER — Ambulatory Visit (HOSPITAL_COMMUNITY): Admit: 2023-03-06 | Payer: No Typology Code available for payment source

## 2023-03-07 ENCOUNTER — Ambulatory Visit (HOSPITAL_COMMUNITY)
Admission: RE | Admit: 2023-03-07 | Discharge: 2023-03-07 | Disposition: A | Discharge status: Home / Self Care | Payer: Self-pay | Source: Ambulatory Visit | Attending: Family Medicine | Admitting: Family Medicine

## 2023-03-07 DIAGNOSIS — R0989 Other specified symptoms and signs involving the circulatory and respiratory systems: Secondary | ICD-10-CM

## 2023-03-07 DIAGNOSIS — R42 Dizziness and giddiness: Secondary | ICD-10-CM

## 2023-03-13 ENCOUNTER — Encounter (HOSPITAL_BASED_OUTPATIENT_CLINIC_OR_DEPARTMENT_OTHER): Payer: Self-pay | Admitting: Family

## 2023-03-13 ENCOUNTER — Ambulatory Visit (HOSPITAL_BASED_OUTPATIENT_CLINIC_OR_DEPARTMENT_OTHER): Payer: BC Managed Care – PPO | Admitting: Family

## 2023-03-13 ENCOUNTER — Ambulatory Visit: Payer: No Typology Code available for payment source | Admitting: General Practice

## 2023-03-13 VITALS — BP 128/68 | HR 68 | Ht 67.0 in | Wt 174.2 lb

## 2023-03-13 DIAGNOSIS — I350 Nonrheumatic aortic (valve) stenosis: Secondary | ICD-10-CM

## 2023-03-13 DIAGNOSIS — R202 Paresthesia of skin: Secondary | ICD-10-CM | POA: Diagnosis not present

## 2023-03-13 DIAGNOSIS — I25118 Atherosclerotic heart disease of native coronary artery with other forms of angina pectoris: Secondary | ICD-10-CM | POA: Diagnosis not present

## 2023-03-13 DIAGNOSIS — M79605 Pain in left leg: Secondary | ICD-10-CM

## 2023-03-13 DIAGNOSIS — E785 Hyperlipidemia, unspecified: Secondary | ICD-10-CM

## 2023-03-13 DIAGNOSIS — M79604 Pain in right leg: Secondary | ICD-10-CM

## 2023-03-13 DIAGNOSIS — R0609 Other forms of dyspnea: Secondary | ICD-10-CM

## 2023-03-13 NOTE — Patient Instructions (Addendum)
 Medication Instructions:  Your physician recommends that you continue on your current medications as directed. Please refer to the Current Medication list given to you today.  *If you need a refill on your cardiac medications before your next appointment, please call your pharmacy*  Lab Work: FASTING LP/CMET IN 2 MONTHS   If you have labs (blood work) drawn today and your tests are completely normal, you will receive your results only by: MyChart Message (if you have MyChart) OR A paper copy in the mail If you have any lab test that is abnormal or we need to change your treatment, we will call you to review the results.   Testing/Procedures: Your physician has requested that you have an echocardiogram. Echocardiography is a painless test that uses sound waves to create images of your heart. It provides your doctor with information about the size and shape of your heart and how well your heart's chambers and valves are working. This procedure takes approximately one hour. There are no restrictions for this procedure. Please do NOT wear cologne, perfume, aftershave, or lotions (deodorant is allowed). Please arrive 15 minutes prior to your appointment time.  Please note: We ask at that you not bring children with you during ultrasound (echo/ vascular) testing. Due to room size and safety concerns, children are not allowed in the ultrasound rooms during exams. Our front office staff cannot provide observation of children in our lobby area while testing is being conducted. An adult accompanying a patient to their appointment will only be allowed in the ultrasound room at the discretion of the ultrasound technician under special circumstances. We apologize for any inconvenience. TO BE DONE END OF YEAR  Your physician has requested that you have an ankle brachial index (ABI). During this test an ultrasound and blood pressure cuff are used to evaluate the arteries that supply the arms and legs with  blood. Allow thirty minutes for this exam. There are no restrictions or special instructions.  Please note: We ask at that you not bring children with you during ultrasound (echo/ vascular) testing. Due to room size and safety concerns, children are not allowed in the ultrasound rooms during exams. Our front office staff cannot provide observation of children in our lobby area while testing is being conducted. An adult accompanying a patient to their appointment will only be allowed in the ultrasound room at the discretion of the ultrasound technician under special circumstances. We apologize for any inconvenience.  Follow-Up: At [facility], you and your health needs are our priority.  As part of our continuing mission to provide you with exceptional heart care, we have created designated Provider Care Teams.  These Care Teams include your primary Cardiologist (physician) and Advanced Practice Providers (APPs -  Physician Assistants and Nurse Practitioners) who all work together to provide you with the care you need, when you need it.  We recommend signing up for the patient portal called MyChart.  Sign up information is provided on this After Visit Summary.  MyChart is used to connect with patients for Virtual Visits (Telemedicine).  Patients are able to view lab/test results, encounter notes, upcoming appointments, etc.  Non-urgent messages can be sent to your provider as well.   To learn more about what you can do with MyChart, go to [email or web address].    Your next appointment:   3 TO 4 month(s)  Provider:   Reche Finder, NP    You have been referred to PULMONARY AND NEUROSURGERY IF YOU DO NOT  HEAR FROM THEM IN 2 WEEKS YOU CAN CALL THEM DIRECTLY AT THE NUMBERS HIGHLIGHTED  Other Instructions

## 2023-03-13 NOTE — Progress Notes (Signed)
 Cardiology Office Note:  .   Date:  03/13/2023  ID:  Amber Stone, DOB Dec 28, 1960, MRN [id number] PCP: de [place], Raymond J, MD  [facility] Providers Cardiologist:  None    History of Present Illness: .   Amber Stone is a 63 y.o. female with history of nonobstructive CAD, hyperlipidemia, asthma, presumed COPD, GERD, mild aortic stenosis.   Admiteed 12/29-02/28/2023 after presenting with progressive malaise, chest pain, shortness of breath.SABRA LHC with mild nonobstructive CAD.  Recommended for medical management echo normal LVEF 60-65%, no RWMA, gr1dd, mild aortic stenosis, mild to moderate AI. Dyspnea improved with Ellipta, presumed underlying COPD related to secondhand smoke exposure and recommended to see pulmonary.  Bilateral paresthesias of the hand felt to be musculoskeletal and recommended follow-up with neurosurgery in the outpatient. Treated for RUE cellulitis. Discharged with [facility].   Presents today for follow up independently. Level of activity gradulaly decreasing over the last few months. Notes her legs get heavy with more activity which makes her feel poorly. In November started hiring someone to walk her dog. 6 weeks of tingling and numbness to entire left arm, often positional as is worse with extension. Previously evaluated by neurosurgery and will refer to reestablish. Additionally feels tight in her left neck. Follows closely with Dr. Vela her allergist. Reviewed possible COPD and interested in pulmonology referral. Endorses lightheadedness with sensation of needing to sit down and feeling when walking occasionally drifts to the left. Notes family history of Meniere's disease. Reviewed heart healthy diet, diet very limited by allergies (I.e., no fish, no nuts).   ROS: Please see the history of present illness.    All other systems reviewed and are negative.   Studies Reviewed: .        Cardiac Studies & Procedures   CARDIAC CATHETERIZATION  CARDIAC  CATHETERIZATION 02/27/2023  Narrative 1.  Mild nonobstructive CAD with mild ostial LAD stenosis, mild proximal LAD stenosis, and patency of the left circumflex and RCA without significant stenoses 2.  Normal/low LVEDP  Recommendations: Medical therapy, aggressive lipid-lowering as tolerated.  Findings Coronary Findings Diagnostic  Dominance: Right  Left Anterior Descending The LAD has mild ostial stenosis of 30%.  The proximal vessel has 30 to 40% stenosis.  There is no obstructive disease throughout the LAD distribution.  The vessel reaches the LV apex and the diagonal branches are patent with no stenoses. Ost LAD lesion is 30% stenosed. Mid LAD lesion is 40% stenosed.  Left Circumflex There is mild diffuse disease throughout the vessel. The LAD and circumflex have separate ostia.  The circumflex has no stenosis.  Vessel is of large caliber with minor irregularities noted.  Right Coronary Artery The vessel exhibits minimal luminal irregularities. Dominant vessel with no obstructive disease  Intervention  No interventions have been documented.    ECHOCARDIOGRAM  ECHOCARDIOGRAM COMPLETE 02/26/2023  Narrative ECHOCARDIOGRAM REPORT    Patient Name:   Amber Stone Date of Exam: 02/26/2023 Medical Rec #:  [id number]           Height:       67.0 in Accession #:    [id number]          Weight:       166.9 lb Date of Birth:  [birth year]            BSA:          1.873 m Patient Age:    62 years  BP:           162/68 mmHg Patient Gender: F                   HR:           65 bpm. Exam Location:  Inpatient  Procedure: 2D Echo, Cardiac Doppler and Color Doppler  Indications:    Chest Pain R07.9  History:        Patient has no prior history of Echocardiogram examinations. Signs/Symptoms:Chest Pain; Risk Factors:Hypertension, Sleep Apnea and Dyslipidemia.  Sonographer:    Thea Norlander RCS Referring Phys: [id number] Amber Stone  IMPRESSIONS   1. Left ventricular  ejection fraction, by estimation, is 60 to 65%. The left ventricle has normal function. The left ventricle has no regional wall motion abnormalities. Left ventricular diastolic parameters are consistent with Grade I diastolic dysfunction (impaired relaxation). 2. Right ventricular systolic function is normal. The right ventricular size is normal. 3. Left atrial size was moderately dilated. 4. The mitral valve is normal in structure. Trivial mitral valve regurgitation. No evidence of mitral stenosis. 5. The aortic valve is tricuspid. Aortic valve regurgitation is mild to moderate. Mild aortic valve stenosis. Aortic regurgitation PHT measures 468 msec. Aortic valve area, by VTI measures 1.19 cm. Aortic valve mean gradient measures 13.5 mmHg. Aortic valve Vmax measures 2.55 m/s. 6. The inferior vena cava is normal in size with greater than 50% respiratory variability, suggesting right atrial pressure of 3 mmHg.  FINDINGS Left Ventricle: Left ventricular ejection fraction, by estimation, is 60 to 65%. The left ventricle has normal function. The left ventricle has no regional wall motion abnormalities. The left ventricular internal cavity size was normal in size. There is no left ventricular hypertrophy. Left ventricular diastolic parameters are consistent with Grade I diastolic dysfunction (impaired relaxation). Indeterminate filling pressures.  Right Ventricle: The right ventricular size is normal. No increase in right ventricular wall thickness. Right ventricular systolic function is normal.  Left Atrium: Left atrial size was moderately dilated.  Right Atrium: Right atrial size was normal in size.  Pericardium: There is no evidence of pericardial effusion.  Mitral Valve: The mitral valve is normal in structure. Trivial mitral valve regurgitation. No evidence of mitral valve stenosis.  Tricuspid Valve: The tricuspid valve is normal in structure. Tricuspid valve regurgitation is trivial. No  evidence of tricuspid stenosis.  Aortic Valve: The aortic valve is tricuspid. Aortic valve regurgitation is mild to moderate. Aortic regurgitation PHT measures 468 msec. Mild aortic stenosis is present. Aortic valve mean gradient measures 13.5 mmHg. Aortic valve peak gradient measures 26.0 mmHg. Aortic valve area, by VTI measures 1.19 cm.  Pulmonic Valve: The pulmonic valve was normal in structure. Pulmonic valve regurgitation is trivial. No evidence of pulmonic stenosis.  Aorta: The aortic root is normal in size and structure.  Venous: The inferior vena cava is normal in size with greater than 50% respiratory variability, suggesting right atrial pressure of 3 mmHg.  IAS/Shunts: No atrial level shunt detected by color flow Doppler.   LEFT VENTRICLE PLAX 2D LVIDd:         4.10 cm   Diastology LVIDs:         2.80 cm   LV e' medial:    7.46 cm/s LV PW:         2.10 cm   LV E/e' medial:  13.0 LV IVS:        1.20 cm   LV e' lateral:   8.55 cm/s LVOT diam:  1.90 cm   LV E/e' lateral: 11.3 LV SV:         66 LV SV Index:   35 LVOT Area:     2.84 cm   RIGHT VENTRICLE             IVC RV S prime:     15.10 cm/s  IVC diam: 1.80 cm TAPSE (M-mode): 2.2 cm  LEFT ATRIUM             Index        RIGHT ATRIUM           Index LA diam:        3.50 cm 1.87 cm/m   RA Area:     14.20 cm LA Vol (A2C):   66.0 ml 35.25 ml/m  RA Volume:   31.60 ml  16.88 ml/m LA Vol (A4C):   61.4 ml 32.79 ml/m LA Biplane Vol: 65.0 ml 34.71 ml/m AORTIC VALVE AV Area (Vmax):    1.29 cm AV Area (Vmean):   1.25 cm AV Area (VTI):     1.19 cm AV Vmax:           254.75 cm/s AV Vmean:          169.000 cm/s AV VTI:            0.554 m AV Peak Grad:      26.0 mmHg AV Mean Grad:      13.5 mmHg LVOT Vmax:         116.00 cm/s LVOT Vmean:        74.300 cm/s LVOT VTI:          0.232 m LVOT/AV VTI ratio: 0.42 AI PHT:            468 msec  AORTA Ao Root diam: 3.30 cm Ao Asc diam:  3.30 cm  MITRAL VALVE                 TRICUSPID VALVE MV Area (PHT): 3.72 cm     TR Peak grad:   21.0 mmHg MV Decel Time: 204 msec     TR Vmax:        229.00 cm/s MR Peak grad: 20.4 mmHg MR Vmax:      226.00 cm/s   SHUNTS MV E velocity: 96.80 cm/s   Systemic VTI:  0.23 m MV A velocity: 126.00 cm/s  Systemic Diam: 1.90 cm MV E/A ratio:  0.77  Annabella Scarce MD Electronically signed by Annabella Scarce MD Signature Date/Time: 02/26/2023/5:40:35 PM    Final             Risk Assessment/Calculations:             Physical Exam:   VS:  BP 128/68 (BP Location: Right Arm, Patient Position: Sitting, Cuff Size: Large)   Pulse 68   Ht 5' 7 (1.702 m)   Wt 174 lb 3.2 oz (79 kg)   SpO2 95%   BMI 27.28 kg/m    Wt Readings from Last 3 Encounters:  03/13/23 174 lb 3.2 oz (79 kg)  02/26/23 166 lb 14.2 oz (75.7 kg)  02/23/23 170 lb 8 oz (77.3 kg)    GEN: Well nourished, overweight. well developed in no acute distress NECK: No JVD; No carotid bruits CARDIAC: RRR, no murmurs, rubs, gallops RESPIRATORY:  Clear to auscultation without rales, wheezing or rhonchi  ABDOMEN: Soft, non-tender, non-distended EXTREMITIES:  No edema; No deformity   ASSESSMENT AND PLAN: .    CAD -  Nonobstructive CAD by [facility] 01/2023. GDMT Aspirin , Rosuvastatin . Recommend aiming for 150 minutes of moderate intensity activity per week and following a heart healthy diet.    Claudication - Reports LE heaviness with walking. Plan for ABI to rule out PAD.   HLD, LDL goal <70 - Did not tolerate Atorvastatin , pravastatin . Continue Rosuvastatin  10mg  daily. Tolerating without issue .Refill provided. FLP/CMP in 2 months for monitoring. If develops intolerance to Rosuvastatin  consider PCSK9i.   DOE / ? COPD / Asthma - Started on Dulera  during admission. Does not have Ellipta. Refer to pulmonology to establish care. Will reach out to PCP to see if they can assist with Dulera  or Ellipta refills.   Mild bilateral carotid stenosis -noted by duplex 02/2023.   Continue rosuvastatin  to prevent progression.  Could consider repeat duplex in 1 year for monitoring.  Can discuss at follow-up.  This would not contribute to her bilateral paresthesias.  GERD - Continue to follow with PCP.   Bilateral paresthesias to hands - More predominantly in L arm. Will refer to neurosurgery to re-establish care.   Mild AS - Noted by echo 01/2022. Plan to repeat echo in 1 year for monitoring.   RUE Celluitis - Resolved with abx received during recent admission.         Dispo: follow up 3-4 mos  Signed, Reche GORMAN Finder, NP

## 2023-03-13 NOTE — Progress Notes (Signed)
 Carotid US unremarkable for significant stenosis. Will forward to Cardiology for review as patient established care 03/12/22. Recommend continuing statin and aspirin therapy as directed by Cardiology.

## 2023-03-19 ENCOUNTER — Other Ambulatory Visit (HOSPITAL_BASED_OUTPATIENT_CLINIC_OR_DEPARTMENT_OTHER): Payer: Self-pay | Admitting: Family Medicine

## 2023-03-19 ENCOUNTER — Encounter (HOSPITAL_BASED_OUTPATIENT_CLINIC_OR_DEPARTMENT_OTHER): Payer: Self-pay | Admitting: Family Medicine

## 2023-03-19 MED ORDER — UMECLIDINIUM BROMIDE 62.5 MCG/ACT IN AEPB: 1.0000 | INHALATION_SPRAY | Freq: Every day | RESPIRATORY_TRACT | 5 refills | Status: DC

## 2023-03-21 ENCOUNTER — Encounter (HOSPITAL_BASED_OUTPATIENT_CLINIC_OR_DEPARTMENT_OTHER): Payer: Self-pay

## 2023-03-21 NOTE — Telephone Encounter (Signed)
Prior authorization was initiated for incruse ellipta. Prior authorization was cancelled. It says that it was cancelled because it was denied 03/02/23.  Called [employer] for more information. Was told that inhalers that are preferred by pt's insurance include:  Breo ellipta, anoro ellipta, wixela, serevent diskus, stiolto respimat, striverdi respimat    Routing to [place] for review.

## 2023-03-22 ENCOUNTER — Other Ambulatory Visit (HOSPITAL_BASED_OUTPATIENT_CLINIC_OR_DEPARTMENT_OTHER): Payer: Self-pay | Admitting: Family Medicine

## 2023-03-22 DIAGNOSIS — J449 Chronic obstructive pulmonary disease, unspecified: Secondary | ICD-10-CM

## 2023-03-22 NOTE — Telephone Encounter (Signed)
Called and spoke with pt about her inhalers. Pt is currently using symbicort and spiriva and is doing okay on these inhalers currently. Pt did have a referral placed by cardiology to see pulmonary. Appt was scheduled for pt to see pulmonary and she will wait to discuss with them about inhaler change.

## 2023-04-02 ENCOUNTER — Ambulatory Visit (HOSPITAL_BASED_OUTPATIENT_CLINIC_OR_DEPARTMENT_OTHER): Payer: No Typology Code available for payment source | Admitting: Radiology

## 2023-04-05 ENCOUNTER — Ambulatory Visit (INDEPENDENT_AMBULATORY_CARE_PROVIDER_SITE_OTHER): Payer: BC Managed Care – PPO

## 2023-04-05 DIAGNOSIS — M79605 Pain in left leg: Secondary | ICD-10-CM

## 2023-04-05 DIAGNOSIS — M79604 Pain in right leg: Secondary | ICD-10-CM | POA: Diagnosis not present

## 2023-04-05 LAB — VAS US ABI WITH/WO TBI
Left ABI: 0.99
Right ABI: 0.75

## 2023-04-06 ENCOUNTER — Encounter (HOSPITAL_BASED_OUTPATIENT_CLINIC_OR_DEPARTMENT_OTHER): Payer: Self-pay

## 2023-04-06 DIAGNOSIS — I739 Peripheral vascular disease, unspecified: Secondary | ICD-10-CM

## 2023-04-09 ENCOUNTER — Telehealth (HOSPITAL_BASED_OUTPATIENT_CLINIC_OR_DEPARTMENT_OTHER): Payer: Self-pay

## 2023-04-09 NOTE — Telephone Encounter (Signed)
 Called and spoke to pt; duplicate call made. Pt already aware.

## 2023-04-09 NOTE — Telephone Encounter (Signed)
 Per Neomi Banks, NP   "ABI consistent with arterial disease in the right leg. Recommend lower extremity arterial duplex.   Triage team - will need 'VAS US  LOWER EXTREMITY ARTERIAL DUPLEX"   Patient called, results reviewed, patient agreeable to follow up testing.

## 2023-04-09 NOTE — Telephone Encounter (Signed)
-----   Message from Clearnce Curia sent at 04/06/2023  3:11 PM EST ----- ABI consistent with arterial disease in the right leg. Recommend lower extremity arterial duplex.   Triage team - will need 'VAS US  LOWER EXTREMITY ARTERIAL DUPLEX'

## 2023-04-12 ENCOUNTER — Encounter (HOSPITAL_BASED_OUTPATIENT_CLINIC_OR_DEPARTMENT_OTHER): Payer: Self-pay | Admitting: Family Medicine

## 2023-04-12 ENCOUNTER — Ambulatory Visit (HOSPITAL_BASED_OUTPATIENT_CLINIC_OR_DEPARTMENT_OTHER): Payer: BC Managed Care – PPO

## 2023-04-12 ENCOUNTER — Ambulatory Visit (HOSPITAL_BASED_OUTPATIENT_CLINIC_OR_DEPARTMENT_OTHER): Payer: BC Managed Care – PPO | Admitting: Family Medicine

## 2023-04-12 DIAGNOSIS — M25551 Pain in right hip: Secondary | ICD-10-CM

## 2023-04-12 DIAGNOSIS — M542 Cervicalgia: Secondary | ICD-10-CM

## 2023-04-12 DIAGNOSIS — M25552 Pain in left hip: Secondary | ICD-10-CM | POA: Diagnosis not present

## 2023-04-12 DIAGNOSIS — R202 Paresthesia of skin: Secondary | ICD-10-CM | POA: Diagnosis not present

## 2023-04-12 MED ORDER — MECLIZINE HCL 25 MG PO TABS: 25.0000 mg | ORAL_TABLET | Freq: Three times a day (TID) | ORAL | 3 refills | Status: AC | PRN

## 2023-04-12 NOTE — Assessment & Plan Note (Signed)
Reports concerns of bilateral hip pain, primarily over anterior hips.  Has been chronic, intermittent issue for patient.  She is requesting of x-rays completed for further assessment. Can proceed with x-rays today and plan to further assess hips at upcoming office visit and can review x-rays at that time as well

## 2023-04-12 NOTE — Patient Instructions (Signed)
  Medication Instructions:  Your physician recommends that you continue on your current medications as directed. Please refer to the Current Medication list given to you today. --If you need a refill on any your medications before your next appointment, please   Referrals/Procedures/Imaging: xray  Follow-Up: Your next appointment:   Your physician recommends that you schedule a follow-up appointment in: keep appointment in March  with Dr. de [place]  You will receive a text message or e-mail with a link to a survey about your care and experience with [place] today! We would greatly appreciate your feedback!   Thanks for letting us be apart of your health journey!!  Primary Care and Sports Medicine   Dr. Ceasar Mons [place]   We encourage you to activate your patient portal called "MyChart".  Sign up information is provided on this After Visit Summary.  MyChart is used to connect with patients for Virtual Visits (Telemedicine).  Patients are able to view lab/test results, encounter notes, upcoming appointments, etc.  Non-urgent messages can be sent to your provider as well. To learn more about what you can do with MyChart, please visit --  [email or web address].

## 2023-04-12 NOTE — Progress Notes (Signed)
    Procedures performed today:    None.  Independent interpretation of notes and tests performed by another provider:   None.  Brief History, Exam, Impression, and Recommendations:    BP 136/82 (BP Location: Right Arm, Patient Position: Sitting, Cuff Size: Normal)   Pulse 89   Ht 5\' 7"  (1.702 m)   Wt 180 lb 14.4 oz (82.1 kg)   SpO2 98%   BMI 28.33 kg/m   Bilateral hip pain Assessment & Plan: Reports concerns of bilateral hip pain, primarily over anterior hips.  Has been chronic, intermittent issue for patient.  She is requesting of x-rays completed for further assessment. Can proceed with x-rays today and plan to further assess hips at upcoming office visit and can review x-rays at that time as well  Orders: -     DG HIP UNILAT W OR W/O PELVIS 2-3 VIEWS LEFT; Future -     DG HIP UNILAT W OR W/O PELVIS 2-3 VIEWS RIGHT; Future  Neck pain Assessment & Plan: History of neck pain with radicular symptoms into both upper extremities, left worse than right.  Has had associated upper extremity tingling.  She has also been noticing some issues with weakness in left upper extremity and has dropped knife recently.  She has had similar issues in the past with prior evaluation with neurosurgery who recommended evaluation with cardiology due to left arm tingling.  She did have cardiac evaluation in the past.  Recently, cardiology did refer patient back to neurosurgery for continued follow-up, however patient reports that she was told by neurosurgery's office that she would need to have evaluation with her PCP regarding symptoms.  She did have episode of dizziness over the weekend, however this has resolved.  She reports that she has had intermittent issues with dizziness in the past.  Typically will use meclizine for this, did use this for recent episode, requesting refill of medication. On exam, patient is in no acute distress, vital signs stable.  Normal gait in office today.  Appropriate  strength of bilateral upper extremities related to shoulder abduction, adduction, wrist extension and flexion, normal grip strength bilaterally.  Normal sensation throughout bilateral upper extremities today in office. Given history and symptoms, concern for spinal cord compression within cervical spine.  We will proceed initially with x-ray imaging today Plan to proceed with MRI once x-ray obtained.  Discussed likely need for further evaluation with neurosurgeon  Orders: -     DG Cervical Spine Complete; Future  Paresthesias -     DG Cervical Spine Complete; Future  Other orders -     Meclizine HCl; Take 1 tablet (25 mg total) by mouth 3 (three) times daily as needed for dizziness or nausea.  Dispense: 30 tablet; Refill: 3  Follow-up at previously scheduled appointment later next month   ___________________________________________ Taahir Grisby de [place], MD, ABFM, CAQSM Primary Care and Sports Medicine [facility]

## 2023-04-12 NOTE — Assessment & Plan Note (Signed)
History of neck pain with radicular symptoms into both upper extremities, left worse than right.  Has had associated upper extremity tingling.  She has also been noticing some issues with weakness in left upper extremity and has dropped knife recently.  She has had similar issues in the past with prior evaluation with neurosurgery who recommended evaluation with cardiology due to left arm tingling.  She did have cardiac evaluation in the past.  Recently, cardiology did refer patient back to neurosurgery for continued follow-up, however patient reports that she was told by neurosurgery's office that she would need to have evaluation with her PCP regarding symptoms.  She did have episode of dizziness over the weekend, however this has resolved.  She reports that she has had intermittent issues with dizziness in the past.  Typically will use meclizine for this, did use this for recent episode, requesting refill of medication. On exam, patient is in no acute distress, vital signs stable.  Normal gait in office today.  Appropriate strength of bilateral upper extremities related to shoulder abduction, adduction, wrist extension and flexion, normal grip strength bilaterally.  Normal sensation throughout bilateral upper extremities today in office. Given history and symptoms, concern for spinal cord compression within cervical spine.  We will proceed initially with x-ray imaging today Plan to proceed with MRI once x-ray obtained.  Discussed likely need for further evaluation with neurosurgeon

## 2023-04-16 ENCOUNTER — Encounter (HOSPITAL_BASED_OUTPATIENT_CLINIC_OR_DEPARTMENT_OTHER): Payer: Self-pay | Admitting: Family Medicine

## 2023-04-16 ENCOUNTER — Other Ambulatory Visit (HOSPITAL_BASED_OUTPATIENT_CLINIC_OR_DEPARTMENT_OTHER): Payer: Self-pay | Admitting: Family Medicine

## 2023-04-16 DIAGNOSIS — R202 Paresthesia of skin: Secondary | ICD-10-CM

## 2023-04-16 DIAGNOSIS — M542 Cervicalgia: Secondary | ICD-10-CM

## 2023-04-17 ENCOUNTER — Ambulatory Visit: Payer: No Typology Code available for payment source | Admitting: Internal Medicine

## 2023-04-19 ENCOUNTER — Encounter (HOSPITAL_BASED_OUTPATIENT_CLINIC_OR_DEPARTMENT_OTHER): Payer: Self-pay | Admitting: *Deleted

## 2023-04-26 ENCOUNTER — Encounter (HOSPITAL_BASED_OUTPATIENT_CLINIC_OR_DEPARTMENT_OTHER): Payer: Self-pay | Admitting: Family Medicine

## 2023-04-27 ENCOUNTER — Encounter (HOSPITAL_BASED_OUTPATIENT_CLINIC_OR_DEPARTMENT_OTHER): Payer: Self-pay | Admitting: Family Medicine

## 2023-04-27 ENCOUNTER — Other Ambulatory Visit (HOSPITAL_BASED_OUTPATIENT_CLINIC_OR_DEPARTMENT_OTHER): Payer: Self-pay | Admitting: Family

## 2023-04-27 ENCOUNTER — Ambulatory Visit (HOSPITAL_BASED_OUTPATIENT_CLINIC_OR_DEPARTMENT_OTHER): Payer: BC Managed Care – PPO

## 2023-04-27 DIAGNOSIS — I739 Peripheral vascular disease, unspecified: Secondary | ICD-10-CM

## 2023-04-29 LAB — VAS US ABI WITH/WO TBI
Left ABI: 0.88
Right ABI: 0.54

## 2023-04-30 ENCOUNTER — Encounter (HOSPITAL_BASED_OUTPATIENT_CLINIC_OR_DEPARTMENT_OTHER): Payer: Self-pay

## 2023-04-30 MED ORDER — CILOSTAZOL 100 MG PO TABS: 100.0000 mg | ORAL_TABLET | Freq: Two times a day (BID) | ORAL | 5 refills | Status: DC

## 2023-04-30 NOTE — Telephone Encounter (Signed)
 Vertigo would be very atypical side effects of Aspirin. Would she be willing to trial 81mg  three days per week on Mondays, Wednesdays, Friday?  Recommend start Pletal 100mg  twice daily. This will help to reduce claudication pain (pain from decreased blood flow).  Dr. Allyson Sabal has a 'new PV' consult 3/5 at 11:30 AM. Please call to see if she can make that appointment. Will get established with Dr. Allyson Sabal and assessment even if duplex are not til later in the month.   Alver Sorrow, NP

## 2023-04-30 NOTE — Addendum Note (Signed)
 Addended by: Marlene Lard on: 04/30/2023 02:06 PM   Modules accepted: Orders

## 2023-04-30 NOTE — Telephone Encounter (Signed)
 Called patient and scheduled for 3/5 at 1130 with Dr. Allyson Sabal, rx to preferred pharm.

## 2023-04-30 NOTE — Telephone Encounter (Signed)
**Note De-identified  [place] Obfuscation** Please advise 

## 2023-05-01 ENCOUNTER — Encounter (HOSPITAL_BASED_OUTPATIENT_CLINIC_OR_DEPARTMENT_OTHER): Payer: Self-pay | Admitting: Pulmonary Disease

## 2023-05-01 ENCOUNTER — Ambulatory Visit (HOSPITAL_BASED_OUTPATIENT_CLINIC_OR_DEPARTMENT_OTHER): Payer: BC Managed Care – PPO | Admitting: Pulmonary Disease

## 2023-05-01 VITALS — BP 132/90 | HR 88 | Ht 63.0 in | Wt 176.6 lb

## 2023-05-01 DIAGNOSIS — J4540 Moderate persistent asthma, uncomplicated: Secondary | ICD-10-CM | POA: Diagnosis not present

## 2023-05-01 DIAGNOSIS — J309 Allergic rhinitis, unspecified: Secondary | ICD-10-CM | POA: Diagnosis not present

## 2023-05-01 DIAGNOSIS — R0609 Other forms of dyspnea: Secondary | ICD-10-CM

## 2023-05-01 NOTE — Progress Notes (Signed)
 Subjective:    Patient ID: Amber Stone, female    DOB: 10-09-1960, 63 y.o.   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  Echo showed mild aortic stenosis.  She was seen by cardiology in follow-up and referred to pulmonary for ongoing shortness of breath and bronchodilator therapy for asthma  She carries a diagnosis of asthma for 20 years.  She reports onset when she lived in [place].  She reports hypersensitivity to different odors and chemicals.  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  She reports having diagnosed with allergies after exposure to black mold and has been seeing Dr. Matos/allergist in [place] and received allergy shots for 5 years until 09/2022  The patient, with a history of asthma and allergies, presents with new onset shortness of breath. She describes this as different from her usual asthma symptoms, stating that it feels like she is "struggling to breathe" rather than experiencing the upper respiratory symptoms typical of her asthma. This shortness of breath has led to a significant reduction in her activity level over the past six months, with the patient noting difficulty walking and climbing stairs, especially when carrying something or walking on an incline.  The patient's asthma and allergies have been well-managed with allergy shots, Symbicort, and Spiriva. She completed a five-year course of allergy shots in August and has been half-dosing her Symbicort and Spiriva, taking them only in the morning. She has not needed to use a rescue inhaler or nebulizer for the past two to three  years.  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 She has a history of chemical sensitivity, which has improved over time but still causes her to feel unwell in certain environments.  In addition to her respiratory issues, the patient is dealing with vascular problems. She has blockages in her right leg and is scheduled for double dopplers and a consult with a vascular specialist. She believes these vascular issues may be contributing to her shortness of breath.  Significant tests/ events reviewed CTA chest 01/2023 stable nodules since 2016, neg PE CT chest 02/2014  no evidence of pulmonary emboli, a stable right middle lobe pulmonary nodule over the last several years   Past Medical History:  Diagnosis Date   Allergic rhinitis    Allergy to chemicals    Arthritis    hands   Asthma    Asthma due to environmental allergies    Chest pain 03/16/2014   Complication of anesthesia    hard to wake up   Depression    GERD (gastroesophageal reflux disease)    OSA (obstructive sleep apnea)    lost weight, not used CPAP in over 5 yrs   Soft tissue mass, left anterior costal margin 03/26/2013   TONSILLECTOMY, HX OF 12/19/2006   Annotation: 1978 Qualifier: Diagnosis of  By: Laural Benes RN, Erika     Vocal cord dysfunction 12/19/2006   Qualifier: Diagnosis of  By: Coralee North      Past Surgical History:  Procedure Laterality Date   BREAST BIOPSY  Right 05/22/2019   benign   BREAST LUMPECTOMY WITH RADIOACTIVE SEED LOCALIZATION Right 04/24/2019   Procedure: RIGHT BREAST LUMPECTOMY WITH RADIOACTIVE SEED X 2;  Surgeon: Abigail Miyamoto, MD;  Location: [facility];  Service: General;  Laterality: Right;   BREAST SURGERY     bx right side   CHOLECYSTECTOMY     COLONOSCOPY N/A 12/30/2014   Procedure: COLONOSCOPY;  Surgeon: Malissa Hippo, MD;  [facility];   Service: Endoscopy;  Laterality: N/A;  730   GANGLION CYST EXCISION     left hand   LASIK  02/28/1995   LEFT HEART CATH AND CORONARY ANGIOGRAPHY N/A 02/27/2023   Procedure: LEFT HEART CATH AND CORONARY ANGIOGRAPHY;  Surgeon: Tonny Bollman, MD;  Location: [facility] INVAS[facility] CV LAB;  Service: Cardiovascular;  Laterality: N/A;   LIPOMA EXCISION     lower back   NASAL SINUS SURGERY     tonsillectomy  02/28/1976    Allergies  Allergen Reactions   Egg-Derived Products    Other     Clams, hazelnuts., Chemical sensitivities, many fruits, veggies, grass, trees    Social History   Socioeconomic History   Marital status: Married    Spouse name: Not on file   Number of children: Not on file   Years of education: Not on file   Highest education level: Not on file  Occupational History    Comment: [employer]   Tobacco Use   Smoking status: Never    Passive exposure: Never   Smokeless tobacco: Never  Vaping Use   Vaping status: Never Used  Substance and Sexual Activity   Alcohol use: No   Drug use: No   Sexual activity: Not Currently    Birth control/protection: Post-menopausal  Other Topics Concern   Not on file  Social History Narrative   Lives with husband Al 24 years together      Enjoys: crafts, read, bake      Diet: limited due to food allergies- see allergy list    Caffeine:  3-4 cups daily   Water: 4 cups daily working to increase       Wears seat belt    Does not use phone while driving    Smoke Production designer, theatre/television/film    No weapons    Social Drivers of Corporate investment banker Strain: Low Risk  (02/10/2020)   Overall Financial Resource Strain (CARDIA)    Difficulty of Paying Living Expenses: Not hard at all  Food Insecurity: No Food Insecurity (02/26/2023)   Hunger Vital Sign    Worried About Running Out of Food in the Last Year: Never true    Ran Out of Food in the Last Year: Never true  Transportation Needs: No Transportation Needs  (02/26/2023)   PRAPARE - Administrator, Civil Service (Medical): No    Lack of Transportation (Non-Medical): No  Physical Activity: Sufficiently Active (02/10/2020)   Exercise Vital Sign    Days of Exercise per Week: 7 days    Minutes of Exercise per Session: 30 min  Stress: No Stress Concern Present (02/10/2020)   [employer] of Occupational Health - Occupational Stress Questionnaire    Feeling of Stress : Not at all  Social Connections: Moderately Integrated (02/26/2023)   Social Connection and Isolation Panel [NHANES]    Frequency of Communication with Friends and Family: More than three times a week    Frequency of Social Gatherings with Friends and  Family: Once a week    Attends Religious Services: More than 4 times per year    Active Member of Clubs or Organizations: No    Attends Banker Meetings: Never    Marital Status: Married  Catering manager Violence: Not At Risk (02/26/2023)   Humiliation, Afraid, Rape, and Kick questionnaire    Fear of Current or Ex-Partner: No    Emotionally Abused: No    Physically Abused: No    Sexually Abused: No    Family History  Problem Relation Age of Onset   Coronary artery disease Mother    Rheum arthritis Mother    Coronary artery disease Father    Heart disease Father    Cancer Maternal Grandfather        leukemia   Cancer Paternal Grandmother        breast   Emphysema Other    Cancer Paternal Aunt        breast     Review of Systems Constitutional: negative for anorexia, fevers and sweats  Eyes: negative for irritation, redness and visual disturbance  Ears, nose, mouth, throat, and face: negative for earaches, epistaxis, nasal congestion and sore throat  Respiratory: negative for cough, sputum and wheezing  Cardiovascular: negative for chest pain,  lower extremity edema, orthopnea, palpitations and syncope  Gastrointestinal: negative for abdominal pain, constipation, diarrhea, melena, nausea  and vomiting  Genitourinary:negative for dysuria, frequency and hematuria  Hematologic/lymphatic: negative for bleeding, easy bruising and lymphadenopathy  Musculoskeletal:negative for arthralgias, muscle weakness and stiff joints  Neurological: negative for coordination problems, gait problems, headaches and weakness  Endocrine: negative for diabetic symptoms including polydipsia, polyuria and weight loss     Objective:   Physical Exam  Gen. Pleasant, obese, in no distress, anxious affect ENT - no pallor,icterus, no post nasal drip, class 2 airway Neck: No JVD, no thyromegaly, no carotid bruits Lungs: no use of accessory muscles, no dullness to percussion, decreased without rales or rhonchi  Cardiovascular: Rhythm regular, heart sounds  normal, no murmurs or gallops, no peripheral edema Abdomen: soft and non-tender, no hepatosplenomegaly, BS normal. Musculoskeletal: No deformities, no cyanosis or clubbing Neuro:  alert, non focal, no tremors       Assessment & Plan:    Shortness of Breath Not typical for asthma, reports a different type of dyspnea compared to asthma, associated with physical activity and pain, suggesting a possible cardiovascular origin. Significant blockage in the right leg, scheduled for double Dopplers and a consult with Dr. Allyson Sabal, potentially leading to an arteriogram. Has not used an inhaler or nebulizer in 2-3 years, managing well with Symbicort and Spiriva. Patient believes dyspnea may be heart-related but is unsure. - Order pulmonary function test to establish a baseline. - Increase Symbicort to two puffs twice daily. - Discontinue Spiriva after the current supply is finished.  Asthma Asthma well-controlled, no use of rescue inhalers in the past 2-3 years. On Symbicort and Spiriva, recently reduced to half dosing. Experienced worsened breathing in December but has since stabilized. Completed allergy shots in August, with no significant asthma symptoms since.  Discussed cost-saving measures and decided to prioritize Symbicort over Spiriva. - Continue Symbicort at increased dose of two puffs twice daily. - Discontinue Spiriva after the current supply is finished. - Monitor asthma symptoms and reassess in six months.  Allergic Rhinitis Severe allergies managed with five years of allergy shots, off since August. Managed well even during allergy season. Reports chemical sensitivities and severe reactions to environmental allergens,  which have improved significantly. Discussed a 33% chance of allergy symptoms returning. - Monitor for recurrence of allergy symptoms. - Consider reinstating hydroxyzine for pruritus if needed during allergy season.  General Health Maintenance Sleep apnea resolved after significant weight loss. Upper airway disease and chemical sensitivities have improved over time. - No current intervention needed for sleep apnea or upper airway disease.  Follow-up - Schedule follow-up appointment in six months to reassess asthma and dyspnea. - Set up pulmonary function test and ensure the patient does not take Symbicort 24 hours before the test.

## 2023-05-01 NOTE — Patient Instructions (Signed)
 x schedule spirometry -pre and post.  Increase Symbicort -2 puffs twice daily. Okay to stop taking Spiriva

## 2023-05-02 ENCOUNTER — Encounter: Payer: Self-pay | Admitting: Cardiovascular Disease

## 2023-05-02 ENCOUNTER — Ambulatory Visit: Attending: Cardiovascular Disease | Admitting: Cardiovascular Disease

## 2023-05-02 VITALS — BP 132/80 | HR 91 | Ht 67.0 in | Wt 177.0 lb

## 2023-05-02 DIAGNOSIS — M25552 Pain in left hip: Secondary | ICD-10-CM

## 2023-05-02 DIAGNOSIS — M25551 Pain in right hip: Secondary | ICD-10-CM | POA: Diagnosis not present

## 2023-05-02 NOTE — Assessment & Plan Note (Signed)
 Ms. Amber Stone was referred to me by Gillian Shields, NP for PAD.  She has a history of hyperlipidemia recently treated, strong family history of heart disease and nonobstructive CAD by cath.  She has noticed right greater than left bilateral hip pain for the last 6 months which has been attributed to orthopedic issues.  Recent ABIs ordered by Gillian Shields on 04/27/2023 revealed a right ABI of 0.54 with monophasic waveforms and a left of 0.88 with biphasic waveforms.  She was begun on Pletal 100 mg p.o. twice daily.  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  Her symptoms are lifestyle-limiting preventing her from doing normal activities and walking her dog.  If she does not enjoy any clinical relief from Pletal we will proceed with angiography and potential endovascular therapy.

## 2023-05-02 NOTE — Progress Notes (Signed)
 05/02/2023 Amber Stone   [birth year]  [id number]  Primary Physician de Stone, Amber J, MD Primary Cardiologist: Runell Gess MD Nicholes Calamity, [place]  HPI:  Amber Stone is a 63 y.o. thin-appearing married Caucasian female, mother of 1 foster daughter, who was referred by Amber Shields, NP for evaluation of claudication.  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  She is plan on retiring in 10 months.  Her risk factors include hyperlipidemia recently put on statin therapy and strong family history of heart disease with a father who died of a myocardial infarction at age 39 and a brother who had an MI in his 16s and bypass surgery.  She is never had a heart attack or stroke.  She denies chest pain but does get dyspneic on exertion.  Her 2D echo is essentially normal other than mild aortic stenosis and grade 1 diastolic dysfunction.  She has noticed right greater than left hip pain for the last 6 months with which is lifestyle limiting.  Keeps her from doing her activities of daily living including cooking, chores and walking her dog.  She had ice performed by Amber Stone 04/27/2023 revealed a right ABI of 0.54 with monophasic waveforms and a left of 0.88.  She was begun on Pletal 100 mg p.o. twice daily yesterday.  I suspect she has either occluded or high-grade right common iliac artery stenosis and will most likely fail Pletal at which point we will proceed with outpatient peripheral angiography and potential endovascular therapy.   Current Meds  Medication Sig   albuterol (VENTOLIN HFA) 108 (90 Base) MCG/ACT inhaler Inhale 2 puffs into the lungs every 4 (four) hours as needed for wheezing or shortness of breath.   aspirin EC 81 MG tablet Take 1 tablet (81 mg total) by mouth daily. Swallow whole.   budesonide-formoterol (SYMBICORT) 160-4.5 MCG/ACT inhaler Inhale 2 puffs into the lungs 2 (two) times daily. (Patient taking differently:  Inhale 2 puffs into the lungs in the morning.)   cetirizine (ZYRTEC) 10 MG tablet Take 10 mg by mouth daily.   Cholecalciferol (VITAMIN D-3) 5000 UNITS TABS Take 1 tablet by mouth daily.   cilostazol (PLETAL) 100 MG tablet Take 1 tablet (100 mg total) by mouth 2 (two) times daily.   clemastine (TAVIST) 2.68 MG TABS tablet Take 2.68 mg by mouth 2 (two) times daily. Take 2.68mg  (1 tablet) by mouth every morning and 6.68-5.36mg  (1-2 tablets) at night.   hydrOXYzine (ATARAX/VISTARIL) 25 MG tablet Take 25 mg by mouth at bedtime.   Lifitegrast (XIIDRA OP) Place 1 drop into both eyes 2 (two) times daily.   meclizine (ANTIVERT) 25 MG tablet Take 1 tablet (25 mg total) by mouth 3 (three) times daily as needed for dizziness or nausea.   montelukast (SINGULAIR) 10 MG tablet Take 10 mg by mouth in the morning.   Multiple Vitamin (MULTIVITAMIN) capsule Take 1 capsule by mouth daily.   omeprazole (PRILOSEC) 40 MG capsule TAKE ONE CAPSULE BY MOUTH DAILY   [DISCONTINUED] clindamycin (CLEOCIN) 300 MG capsule Take 300 mg by mouth 3 (three) times daily.   [DISCONTINUED] famotidine (PEPCID) 40 MG tablet Take 40 mg by mouth 2 (two) times daily.     Allergies  Allergen Reactions   Egg-Derived Products    Other     Clams, hazelnuts., Chemical sensitivities, many fruits, veggies, grass, trees    Social History   Socioeconomic History   Marital status: Married  Spouse name: Not on file   Number of children: Not on file   Years of education: Not on file   Highest education level: Not on file  Occupational History    Comment: [employer]   Tobacco Use   Smoking status: Never    Passive exposure: Never   Smokeless tobacco: Never  Vaping Use   Vaping status: Never Used  Substance and Sexual Activity   Alcohol use: No   Drug use: No   Sexual activity: Not Currently    Birth control/protection: Post-menopausal  Other Topics Concern   Not on file  Social History Narrative   Lives with  husband Al 24 years together      Enjoys: crafts, read, bake      Diet: limited due to food allergies- see allergy list    Caffeine:  3-4 cups daily   Water: 4 cups daily working to increase       Wears seat belt    Does not use phone while driving    Smoke Production designer, theatre/television/film    No weapons    Social Drivers of Corporate investment banker Strain: Low Risk  (02/10/2020)   Overall Financial Resource Strain (CARDIA)    Difficulty of Paying Living Expenses: Not hard at all  Food Insecurity: No Food Insecurity (02/26/2023)   Hunger Vital Sign    Worried About Running Out of Food in the Last Year: Never true    Ran Out of Food in the Last Year: Never true  Transportation Needs: No Transportation Needs (02/26/2023)   PRAPARE - Administrator, Civil Service (Medical): No    Lack of Transportation (Non-Medical): No  Physical Activity: Sufficiently Active (02/10/2020)   Exercise Vital Sign    Days of Exercise per Week: 7 days    Minutes of Exercise per Session: 30 min  Stress: No Stress Concern Present (02/10/2020)   [employer] of Occupational Health - Occupational Stress Questionnaire    Feeling of Stress : Not at all  Social Connections: Moderately Integrated (02/26/2023)   Social Connection and Isolation Panel [NHANES]    Frequency of Communication with Friends and Family: More than three times a week    Frequency of Social Gatherings with Friends and Family: Once a week    Attends Religious Services: More than 4 times per year    Active Member of [employer] or Organizations: No    Attends Banker Meetings: Never    Marital Status: Married  Catering manager Violence: Not At Risk (02/26/2023)   Humiliation, Afraid, Rape, and Kick questionnaire    Fear of Current or Ex-Partner: No    Emotionally Abused: No    Physically Abused: No    Sexually Abused: No     Review of Systems: General: negative for chills, fever, night sweats or weight  changes.  Cardiovascular: negative for chest pain, dyspnea on exertion, edema, orthopnea, palpitations, paroxysmal nocturnal dyspnea or shortness of breath Dermatological: negative for rash Respiratory: negative for cough or wheezing Urologic: negative for hematuria Abdominal: negative for nausea, vomiting, diarrhea, bright red blood per rectum, melena, or hematemesis Neurologic: negative for visual changes, syncope, or dizziness All other systems reviewed and are otherwise negative except as noted above.    Blood pressure 132/80, pulse 91, height 5\' 7"  (1.702 m), weight 177 lb (80.3 kg), SpO2 95%.  General appearance: alert and no distress Neck: no adenopathy, no carotid bruit, no JVD, supple, symmetrical, trachea  midline, and thyroid not enlarged, symmetric, no tenderness/mass/nodules Lungs: clear to auscultation bilaterally Heart: regular rate and rhythm, S1, S2 normal, no murmur, click, rub or gallop Extremities: extremities normal, atraumatic, no cyanosis or edema Pulses: 2+ and symmetric Skin: Skin color, texture, turgor normal. No rashes or lesions Neurologic: Grossly normal  EKG not performed today      ASSESSMENT AND PLAN:   Bilateral hip pain Ms. Troy Sine was referred to me by Amber Shields, NP for PAD.  She has a history of hyperlipidemia recently treated, strong family history of heart disease and nonobstructive CAD by cath.  She has noticed right greater than left bilateral hip pain for the last 6 months which has been attributed to orthopedic issues.  Recent ABIs ordered by Amber Stone on 04/27/2023 revealed a right ABI of 0.54 with monophasic waveforms and a left of 0.88 with biphasic waveforms.  She was begun on Pletal 100 mg p.o. twice daily.  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  Her symptoms are lifestyle-limiting preventing her from doing normal activities and walking her dog.  If she does not enjoy any clinical  relief from Pletal we will proceed with angiography and potential endovascular therapy.     Runell Gess MD FACP,FACC,FAHA, [facility] 05/02/2023 12:30 PM

## 2023-05-02 NOTE — Patient Instructions (Signed)
 Medication Instructions:  Your physician recommends that you continue on your current medications as directed. Please refer to the Current Medication list given to you today.  *If you need a refill on your cardiac medications before your next appointment, please call your pharmacy*   Follow-Up: At [facility], you and your health needs are our priority.  As part of our continuing mission to provide you with exceptional heart care, we have created designated Provider Care Teams.  These Care Teams include your primary Cardiologist (physician) and Advanced Practice Providers (APPs -  Physician Assistants and Nurse Practitioners) who all work together to provide you with the care you need, when you need it.  We recommend signing up for the patient portal called "MyChart".  Sign up information is provided on this After Visit Summary.  MyChart is used to connect with patients for Virtual Visits (Telemedicine).  Patients are able to view lab/test results, encounter notes, upcoming appointments, etc.  Non-urgent messages can be sent to your provider as well.   To learn more about what you can do with MyChart, go to [email or web address].    Your next appointment:   2 month(s)  Provider:   Nanetta Batty, MD   Other Instructions   1st Floor: - Lobby - Registration  - Pharmacy  - Lab - Cafe  2nd Floor: - PV Lab - Diagnostic Testing (echo, CT, nuclear med)  3rd Floor: - Vacant  4th Floor: - TCTS (cardiothoracic surgery) - AFib Clinic - Structural Heart Clinic - Vascular Surgery  - Vascular Ultrasound  5th Floor: - HeartCare Cardiology (general and EP) - Clinical Pharmacy for coumadin, hypertension, lipid, weight-loss medications, and med management appointments    Valet parking services will be available as well.

## 2023-05-08 ENCOUNTER — Ambulatory Visit
Admission: RE | Admit: 2023-05-08 | Discharge: 2023-05-08 | Disposition: A | Discharge status: Home / Self Care | Payer: BC Managed Care – PPO | Source: Ambulatory Visit | Attending: Family Medicine

## 2023-05-08 ENCOUNTER — Encounter (HOSPITAL_BASED_OUTPATIENT_CLINIC_OR_DEPARTMENT_OTHER): Payer: Self-pay | Admitting: *Deleted

## 2023-05-08 DIAGNOSIS — R202 Paresthesia of skin: Secondary | ICD-10-CM

## 2023-05-08 DIAGNOSIS — M542 Cervicalgia: Secondary | ICD-10-CM

## 2023-05-10 ENCOUNTER — Ambulatory Visit (HOSPITAL_BASED_OUTPATIENT_CLINIC_OR_DEPARTMENT_OTHER): Payer: BC Managed Care – PPO | Admitting: Radiology

## 2023-05-11 ENCOUNTER — Ambulatory Visit (HOSPITAL_BASED_OUTPATIENT_CLINIC_OR_DEPARTMENT_OTHER): Admitting: Pulmonary Disease

## 2023-05-11 ENCOUNTER — Other Ambulatory Visit (HOSPITAL_BASED_OUTPATIENT_CLINIC_OR_DEPARTMENT_OTHER): Payer: Self-pay | Admitting: Family Medicine

## 2023-05-11 DIAGNOSIS — J4540 Moderate persistent asthma, uncomplicated: Secondary | ICD-10-CM | POA: Diagnosis not present

## 2023-05-11 LAB — PULMONARY FUNCTION TEST
FEF 25-75 Post: 4.27 L/s
FEF 25-75 Pre: 3.73 L/s
FEF2575-%Change-Post: 14 %
FEF2575-%Pred-Post: 181 %
FEF2575-%Pred-Pre: 158 %
FEV1-%Change-Post: 2 %
FEV1-%Pred-Post: 108 %
FEV1-%Pred-Pre: 105 %
FEV1-Post: 2.97 L
FEV1-Pre: 2.9 L
FEV1FVC-%Change-Post: 0 %
FEV1FVC-%Pred-Pre: 113 %
FEV6-%Change-Post: 1 %
FEV6-%Pred-Post: 97 %
FEV6-%Pred-Pre: 96 %
FEV6-Post: 3.34 L
FEV6-Pre: 3.31 L
FEV6FVC-%Pred-Post: 103 %
FEV6FVC-%Pred-Pre: 103 %
FVC-%Change-Post: 1 %
FVC-%Pred-Post: 94 %
FVC-%Pred-Pre: 92 %
FVC-Post: 3.37 L
FVC-Pre: 3.31 L
Post FEV1/FVC ratio: 88 %
Post FEV6/FVC ratio: 100 %
Pre FEV1/FVC ratio: 88 %
Pre FEV6/FVC Ratio: 100 %

## 2023-05-11 NOTE — Progress Notes (Signed)
 Pre/Post Spirometry Performed Today.

## 2023-05-11 NOTE — Patient Instructions (Signed)
 Pre/Post Spirometry Performed Today.

## 2023-05-14 NOTE — Progress Notes (Signed)
 Pt.notified

## 2023-05-22 ENCOUNTER — Institutional Professional Consult (permissible substitution): Payer: BC Managed Care – PPO | Admitting: Cardiovascular Disease

## 2023-05-22 ENCOUNTER — Ambulatory Visit (HOSPITAL_COMMUNITY)
Admission: RE | Admit: 2023-05-22 | Discharge: 2023-05-22 | Disposition: A | Discharge status: Home / Self Care | Payer: BC Managed Care – PPO | Source: Ambulatory Visit | Attending: Cardiology | Admitting: Cardiology

## 2023-05-22 ENCOUNTER — Ambulatory Visit (HOSPITAL_BASED_OUTPATIENT_CLINIC_OR_DEPARTMENT_OTHER)
Admission: RE | Admit: 2023-05-22 | Discharge: 2023-05-22 | Disposition: A | Discharge status: Home / Self Care | Payer: BC Managed Care – PPO | Source: Ambulatory Visit | Attending: Cardiology | Admitting: Cardiology

## 2023-05-22 DIAGNOSIS — I739 Peripheral vascular disease, unspecified: Secondary | ICD-10-CM

## 2023-05-24 ENCOUNTER — Encounter (HOSPITAL_BASED_OUTPATIENT_CLINIC_OR_DEPARTMENT_OTHER): Payer: Self-pay

## 2023-05-24 NOTE — Telephone Encounter (Addendum)
 Results called to patient who verbalizes understanding! Patient states she has seen some improvement in her legs since the medication, she notes she isnt being woken up in the middle of the night anymore. Advised would update the team and call back if we had further recommendations.    ----- Message from Alver Sorrow sent at 05/24/2023 12:58 PM EDT ----- Left SFA with 50-74% stenosis. Interestingly her symptoms were R>L at office visits. Patent iliac arteries.   Kalie Cabral - Can you please call to let her know her left superficial femoral artery did show stenosis. Please inquire if any improvement in her hip/leg pain since addition of Pletal.   Dr. Allyson Sabal - adding you as Lorain Childes

## 2023-05-25 ENCOUNTER — Encounter (HOSPITAL_BASED_OUTPATIENT_CLINIC_OR_DEPARTMENT_OTHER): Payer: Self-pay | Admitting: Family Medicine

## 2023-05-25 ENCOUNTER — Ambulatory Visit (HOSPITAL_BASED_OUTPATIENT_CLINIC_OR_DEPARTMENT_OTHER): Payer: No Typology Code available for payment source | Admitting: Family Medicine

## 2023-05-25 VITALS — BP 125/72 | HR 77 | Ht 67.0 in | Wt 183.9 lb

## 2023-05-25 DIAGNOSIS — M542 Cervicalgia: Secondary | ICD-10-CM | POA: Diagnosis not present

## 2023-05-25 NOTE — Progress Notes (Signed)
    Procedures performed today:    None.  Independent interpretation of notes and tests performed by another provider:   None.  Brief History, Exam, Impression, and Recommendations:    BP 125/72 (BP Location: Left Arm, Patient Position: Sitting, Cuff Size: Normal)   Pulse 77   Ht 5\' 7"  (1.702 m)   Wt 183 lb 14.4 oz (83.4 kg)   SpO2 99%   BMI 28.80 kg/m   There are no diagnoses linked to this encounter.  No follow-ups on file.   ___________________________________________ Candido Flott de [place], MD, ABFM, [facility] and Sports Medicine [facility]

## 2023-05-25 NOTE — Patient Instructions (Signed)
  Medication Instructions:  Your physician recommends that you continue on your current medications as directed. Please refer to the Current Medication list given to you today. --If you need a refill on any your medications before your next appointment, please call your pharmacy first. If no refills are authorized on file call the office.-- Lab Work: Your physician has recommended that you have lab work today: no If you have labs (blood work) drawn today and your tests are completely normal, you will receive your results via MyChart message OR a phone call from our staff.  Please ensure you check your voicemail in the event that you authorized detailed messages to be left on a delegated number. If you have any lab test that is abnormal or we need to change your treatment, we will call you to review the results.  Referrals/Procedures/Imaging: no  Follow-Up: Your next appointment:   Your physician recommends that you schedule a follow-up appointment in: 4-6 months with Dr. de Peru  You will receive a text message or e-mail with a link to a survey about your care and experience with [place] today! We would greatly appreciate your feedback!   Thanks for letting us be apart of your health journey!!  Primary Care and Sports Medicine   Dr. Ceasar Mons [place]   We encourage you to activate your patient portal called "MyChart".  Sign up information is provided on this After Visit Summary.  MyChart is used to connect with patients for Virtual Visits (Telemedicine).  Patients are able to view lab/test results, encounter notes, upcoming appointments, etc.  Non-urgent messages can be sent to your provider as well. To learn more about what you can do with MyChart, please visit --  [email or web address].

## 2023-05-25 NOTE — Assessment & Plan Note (Signed)
 Patient does continue to have neck pain as well as radicular symptoms, primarily affecting left upper extremity.  She has had x-rays completed and has also completed MRI, however we still are awaiting report from MRI.  We did place referral to neurosurgeon, however patient is still waiting to get this scheduled.  She reports being told previously that she would need to have done before being able to schedule appointment. We will reach out to imaging department to look to get imaging report.  Discussed that she could contact neurosurgeons office to see if they would be able to schedule appointment given that she has had x-rays and MRI completed, but only waiting on MRI report.  She was provided with contact information for neurosurgeon today

## 2023-05-28 NOTE — Telephone Encounter (Signed)
 Called patient with updated information, patient verbalizes understanding.

## 2023-05-29 ENCOUNTER — Institutional Professional Consult (permissible substitution) (HOSPITAL_BASED_OUTPATIENT_CLINIC_OR_DEPARTMENT_OTHER): Payer: BC Managed Care – PPO | Admitting: Pulmonary Disease

## 2023-06-15 ENCOUNTER — Ambulatory Visit (HOSPITAL_BASED_OUTPATIENT_CLINIC_OR_DEPARTMENT_OTHER): Payer: BC Managed Care – PPO | Admitting: Family

## 2023-06-15 ENCOUNTER — Encounter (HOSPITAL_BASED_OUTPATIENT_CLINIC_OR_DEPARTMENT_OTHER): Payer: Self-pay | Admitting: Radiology

## 2023-06-15 ENCOUNTER — Ambulatory Visit (HOSPITAL_BASED_OUTPATIENT_CLINIC_OR_DEPARTMENT_OTHER)
Admission: RE | Admit: 2023-06-15 | Discharge: 2023-06-15 | Disposition: A | Discharge status: Home / Self Care | Source: Ambulatory Visit | Attending: Family Medicine | Admitting: Radiology

## 2023-06-15 VITALS — BP 130/72 | HR 76 | Ht 67.0 in | Wt 182.9 lb

## 2023-06-15 DIAGNOSIS — I25118 Atherosclerotic heart disease of native coronary artery with other forms of angina pectoris: Secondary | ICD-10-CM

## 2023-06-15 DIAGNOSIS — Z1231 Encounter for screening mammogram for malignant neoplasm of breast: Secondary | ICD-10-CM | POA: Insufficient documentation

## 2023-06-15 DIAGNOSIS — I739 Peripheral vascular disease, unspecified: Secondary | ICD-10-CM

## 2023-06-15 DIAGNOSIS — E785 Hyperlipidemia, unspecified: Secondary | ICD-10-CM

## 2023-06-15 NOTE — Patient Instructions (Addendum)
 Medication Instructions:  Your physician recommends that you continue on your current medications as directed. Please refer to the Current Medication list given to you today.  *If you need a refill on your cardiac medications before your next appointment, please call your pharmacy*  Lab Work: Have your lab work to check your cholesterol over when you see Dr. Court. If your LDL (bad cholesterol) is not less than 70, we could trial adding either Ezetimibe (Zetia) or Bempedoic Acid (Nexletol). These are non-statin medications that help to further lower cholesterol with your Rosuvastatin . They do not cause muscle cramps, leg pain, etcetera.   Follow-Up: At [facility], you and your health needs are our priority.  As part of our continuing mission to provide you with exceptional heart care, our providers are all part of one team.  This team includes your primary Cardiologist (physician) and Advanced Practice Providers or APPs (Physician Assistants and Nurse Practitioners) who all work together to provide you with the care you need, when you need it.  Your next appointment:   4-6 months with Reche Finder, NP   We recommend signing up for the patient portal called MyChart.  Sign up information is provided on this After Visit Summary.  MyChart is used to connect with patients for Virtual Visits (Telemedicine).  Patients are able to view lab/test results, encounter notes, upcoming appointments, etc.  Non-urgent messages can be sent to your provider as well.   To learn more about what you can do with MyChart, go to [email or web address].   Other Instructions   1st Floor: - Lobby - Registration  - Pharmacy  - Lab - Cafe  2nd Floor: - PV Lab - Diagnostic Testing (echo, CT, nuclear med)  3rd Floor: - Vacant  4th Floor: - TCTS (cardiothoracic surgery) - AFib Clinic - Structural Heart Clinic - Vascular Surgery  - Vascular Ultrasound  5th Floor: - HeartCare Cardiology  (general and EP) - Clinical Pharmacy for coumadin, hypertension, lipid, weight-loss medications, and med management appointments    Valet parking services will be available as well.

## 2023-06-15 NOTE — Progress Notes (Signed)
 Cardiology Office Note:  .   Date:  06/15/2023  ID:  Amber Stone, DOB 05/16/1960, MRN [id number] PCP: de [place], Raymond J, MD  [facility] Providers Cardiologist:  Dorn Lesches, MD Cardiology APP:  Vannie Reche RAMAN, NP    History of Present Illness: .   Amber Stone is a 63 y.o. female with history of nonobstructive CAD, hyperlipidemia, asthma, presumed COPD, GERD, mild aortic stenosis.   Admiteed 12/29-02/28/2023 after presenting with progressive malaise, chest pain, shortness of breath.Amber Stone with mild nonobstructive CAD.  Recommended for medical management echo normal LVEF 60-65%, no RWMA, gr1dd, mild aortic stenosis, mild to moderate AI. Dyspnea improved with Ellipta, presumed underlying COPD related to secondhand smoke exposure and recommended to see pulmonary.  Bilateral paresthesias of the hand felt to be musculoskeletal and recommended follow-up with neurosurgery in the outpatient. Treated for RUE cellulitis. Discharged with [facility] PT.   At visit 03/13/2023 due to lower extremity heaviness with walking ABI ordered with right ABI 0.75, left ABI 0.99.  She was started on Pletal  and referred to Dr. Wadie.  Subsequent imaging with left lower extremity SFA stenosis as well as stenosis of the right ICA.  Presents today for follow up independently. Hip pain improved from previous and is no longer waking her up at night. The Pletal  has helped but leg pain still limiting daily activity - if shew alsks the length of the house a couple time has to stop and rest.  Grade is noticeable or if her dog is pulling. Considering awaiting intervention until Medicare coverage 02/2024 as she is out of network with [facility] but prefers not to have procedures through [facility] who are in network. Reviewed elevated Lp(a), cholesterol lowering agents. Mild cramping/myalgia with Rousvatatin. She is not interested in injectable therapy. Does not like things that stay in her body long term.   ROS: Please see the  history of present illness.    All other systems reviewed and are negative.   Studies Reviewed: .           Risk Assessment/Calculations:             Physical Exam:   VS:  BP 130/72   Pulse 76   Ht 5' 7 (1.702 m)   Wt 182 lb 14.4 oz (83 kg)   SpO2 100%   BMI 28.65 kg/m    Wt Readings from Last 3 Encounters:  06/15/23 182 lb 14.4 oz (83 kg)  05/25/23 183 lb 14.4 oz (83.4 kg)  05/11/23 183 lb 14.4 oz (83.4 kg)    GEN: Well nourished, overweight. well developed in no acute distress NECK: No JVD; No carotid bruits CARDIAC: RRR, no murmurs, rubs, gallops RESPIRATORY:  Clear to auscultation without rales, wheezing or rhonchi  ABDOMEN: Soft, non-tender, non-distended EXTREMITIES:  No edema; No deformity   ASSESSMENT AND PLAN: .    CAD - Nonobstructive CAD by [facility] 01/2023. GDMT Aspirin  81mg  daily, Rosuvastatin  10mg  daily. Recommend aiming for 150 minutes of moderate intensity activity per week and following a heart healthy diet.    Claudication / PAD - Pletal  100mg  BID has improved claudication, no longer waking her at night. Still limiting daily activities and has to stop and rest after walking longer distances around her home. Consider awaiting until [facility] 02/2024 for any needed procedures. follow up with Dr. Lesches as scheduled.   HLD, LDL goal <70 - Did not tolerate Atorvastatin , pravastatin . Continue Rosuvastatin  10mg  daily. Mild myalgias on rosuvastatin . Declines injectable therapies as  she is hesitant about medications in her body long term. Will cancel lipid clinic appointment. Discussed possible addition of Ezetimibt vs NExletol. Handouts provided.Update lipid panel, LFT at her May visit with Dr. Court, lab slips provided.   Asthma - Follows with Dr. Jude of pulmonology.   Mild bilateral carotid stenosis -noted by duplex 02/2023.  Continue rosuvastatin  to prevent progression.  Could consider repeat duplex in 1 year for monitoring.  Can discuss at follow-up.    GERD -  Continue to follow with PCP.   Mild AS - Noted by echo 01/2023. Plan to repeat echo in 1 year for monitoring.        Dispo: follow up 4-6 mos  Signed, Reche GORMAN Finder, NP

## 2023-06-17 ENCOUNTER — Encounter (HOSPITAL_BASED_OUTPATIENT_CLINIC_OR_DEPARTMENT_OTHER): Payer: Self-pay | Admitting: Family

## 2023-06-20 ENCOUNTER — Encounter (HOSPITAL_BASED_OUTPATIENT_CLINIC_OR_DEPARTMENT_OTHER): Payer: Self-pay | Admitting: Family Medicine

## 2023-07-03 ENCOUNTER — Institutional Professional Consult (permissible substitution) (HOSPITAL_BASED_OUTPATIENT_CLINIC_OR_DEPARTMENT_OTHER): Payer: No Typology Code available for payment source | Admitting: Internal Medicine

## 2023-07-04 ENCOUNTER — Encounter: Payer: Self-pay | Admitting: Cardiovascular Disease

## 2023-07-04 ENCOUNTER — Ambulatory Visit: Attending: Cardiovascular Disease | Admitting: Cardiovascular Disease

## 2023-07-04 VITALS — BP 146/72 | HR 83 | Ht 67.5 in | Wt 182.6 lb

## 2023-07-04 DIAGNOSIS — I10 Essential (primary) hypertension: Secondary | ICD-10-CM

## 2023-07-04 DIAGNOSIS — I739 Peripheral vascular disease, unspecified: Secondary | ICD-10-CM | POA: Insufficient documentation

## 2023-07-04 DIAGNOSIS — I200 Unstable angina: Secondary | ICD-10-CM | POA: Diagnosis not present

## 2023-07-04 DIAGNOSIS — E782 Mixed hyperlipidemia: Secondary | ICD-10-CM

## 2023-07-04 MED ORDER — ROSUVASTATIN CALCIUM 40 MG PO TABS: 40.0000 mg | ORAL_TABLET | Freq: Every day | ORAL | 3 refills | Status: DC

## 2023-07-04 NOTE — Assessment & Plan Note (Signed)
 Ms. Amber Stone was referred to me by Neomi Banks for PAD.  She had bilateral hip pain right much worse than left.  She did have Doppler studies that suggested a right ABI of 0.54 and a left was 0.88.  She did have a high-frequency signal in her proximal right common iliac artery with monophasic waveforms and less severe disease by frequency with monophasic waveforms on the left.  She was begun on Pletal  100 mg p.o. twice daily which markedly improved her symptoms to the point where it still is noticeable but certainly not as much.  She wishes to delay angiography until after the first the year when she goes to Medicare.

## 2023-07-04 NOTE — Assessment & Plan Note (Signed)
 History of essential hypertension with blood pressure measured today at 146/72.  She is not on antihypertensive medications.

## 2023-07-04 NOTE — Patient Instructions (Addendum)
 Medication Instructions:  Your physician has recommended you make the following change in your medication:   -Increase rosuvastatin  (crestor ) 40mg  once daily.  *If you need a refill on your cardiac medications before your next appointment, please call your pharmacy*  Lab Work: Your physician recommends that you return for lab work in: 3 months for FASTING Lipid/liver panel  If you have labs (blood work) drawn today and your tests are completely normal, you will receive your results only by: MyChart Message (if you have MyChart) OR A paper copy in the mail If you have any lab test that is abnormal or we need to change your treatment, we will call you to review the results.   Follow-Up: At [facility], you and your health needs are our priority.  As part of our continuing mission to provide you with exceptional heart care, our providers are all part of one team.  This team includes your primary Cardiologist (physician) and Advanced Practice Providers or APPs (Physician Assistants and Nurse Practitioners) who all work together to provide you with the care you need, when you need it.  Your next appointment:   7 month(s) (mid- December)  Provider:   Lauro Portal, MD    We recommend signing up for the patient portal called "MyChart".  Sign up information is provided on this After Visit Summary.  MyChart is used to connect with patients for Virtual Visits (Telemedicine).  Patients are able to view lab/test results, encounter notes, upcoming appointments, etc.  Non-urgent messages can be sent to your provider as well.   To learn more about what you can do with MyChart, go to [email or web address].

## 2023-07-04 NOTE — Progress Notes (Signed)
 07/04/2023 Amber Stone   [birth year]  [id number]  Primary Physician de [place], Raymond J, MD Primary Cardiologist: Avanell Leigh MD Bennye Bravo, [place]  HPI:  Amber Stone is a 63 y.o.  thin-appearing married Caucasian female, mother of 1 foster daughter, who was referred by Amber Banks, NP for evaluation of claudication. 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 She is plan on retiring in 10 months. Her risk factors include hyperlipidemia recently put on statin therapy and strong family history of heart disease with a father who died of a myocardial infarction at age 12 and a brother who had an MI in his 71s and bypass surgery. She is never had a heart attack or stroke. She denies chest pain but does get dyspneic on exertion. Her 2D echo is essentially normal other than mild aortic stenosis and grade 1 diastolic dysfunction. She has noticed right greater than left hip pain for the last 6 months with which is lifestyle limiting. Keeps her from doing her activities of daily living including cooking, chores and walking her dog. She had ice performed by Amber Stone 04/27/2023 revealed a right ABI of 0.54 with monophasic waveforms and a left of 0.88. She was begun on Pletal  100 mg p.o. twice daily yesterday. I suspect she has either occluded or high-grade right common iliac artery stenosis and will most likely fail Pletal  at which point we will proceed with outpatient peripheral angiography and potential endovascular therapy.  Since I saw her 2 months ago her pain is significantly improved on Pletal  to the point where it only minimally affects her daily quality of life and activities of daily living.  She does have mild to moderate aortic stenosis with a soft outflow tract murmur and noncritical CAD by cath performed at [facility] 02/27/2023.   Current Meds  Medication Sig   aspirin  EC 81 MG tablet Take 1 tablet (81 mg total) by mouth daily.  Swallow whole.   cetirizine (ZYRTEC) 10 MG tablet Take 10 mg by mouth daily.   Cholecalciferol (VITAMIN D-3) 5000 UNITS TABS Take 1 tablet by mouth daily.   cilostazol  (PLETAL ) 100 MG tablet Take 1 tablet (100 mg total) by mouth 2 (two) times daily.   clemastine (TAVIST) 2.68 MG TABS tablet Take 2.68 mg by mouth 2 (two) times daily. Take 2.68mg  (1 tablet) by mouth every morning and 6.68-5.36mg  (1-2 tablets) at night.   hydrOXYzine  (ATARAX /VISTARIL ) 25 MG tablet Take 25 mg by mouth at bedtime.   Lifitegrast (XIIDRA OP) Place 1 drop into both eyes 2 (two) times daily.   meclizine  (ANTIVERT ) 25 MG tablet Take 1 tablet (25 mg total) by mouth 3 (three) times daily as needed for dizziness or nausea.   montelukast  (SINGULAIR ) 10 MG tablet Take 10 mg by mouth in the morning.   Multiple Vitamin (MULTIVITAMIN) capsule Take 1 capsule by mouth daily.   omeprazole  (PRILOSEC) 40 MG capsule TAKE ONE CAPSULE BY MOUTH DAILY     Allergies  Allergen Reactions   Egg-Derived Products    Other     Clams, hazelnuts., Chemical sensitivities, many fruits, veggies, grass, trees    Social History   Socioeconomic History   Marital status: Married    Spouse name: Not on file   Number of children: Not on file   Years of education: Not on file   Highest education level: Not on file  Occupational History    Comment: [employer]  Tobacco Use   Smoking status: Never    Passive exposure: Never   Smokeless tobacco: Never  Vaping Use   Vaping status: Never Used  Substance and Sexual Activity   Alcohol use: No   Drug use: No   Sexual activity: Not Currently    Birth control/protection: Post-menopausal  Other Topics Concern   Not on file  Social History Narrative   Lives with husband Al 24 years together      Enjoys: crafts, read, bake      Diet: limited due to food allergies- see allergy list    Caffeine:  3-4 cups daily   Water : 4 cups daily working to increase       Wears seat belt     Does not use phone while driving    Smoke Production designer, theatre/television/film    No weapons    Social Drivers of Corporate investment banker Strain: Low Risk  (02/10/2020)   Overall Financial Resource Strain (CARDIA)    Difficulty of Paying Living Expenses: Not hard at all  Food Insecurity: No Food Insecurity (02/26/2023)   Hunger Vital Sign    Worried About Running Out of Food in the Last Year: Never true    Ran Out of Food in the Last Year: Never true  Transportation Needs: No Transportation Needs (02/26/2023)   PRAPARE - Administrator, Civil Service (Medical): No    Lack of Transportation (Non-Medical): No  Physical Activity: Sufficiently Active (02/10/2020)   Exercise Vital Sign    Days of Exercise per Week: 7 days    Minutes of Exercise per Session: 30 min  Stress: No Stress Concern Present (02/10/2020)   [employer] of Occupational Health - Occupational Stress Questionnaire    Feeling of Stress : Not at all  Social Connections: Moderately Integrated (02/26/2023)   Social Connection and Isolation Panel [NHANES]    Frequency of Communication with Friends and Family: More than three times a week    Frequency of Social Gatherings with Friends and Family: Once a week    Attends Religious Services: More than 4 times per year    Active Member of [employer] or Organizations: No    Attends Banker Meetings: Never    Marital Status: Married  Catering manager Violence: Not At Risk (02/26/2023)   Humiliation, Afraid, Rape, and Kick questionnaire    Fear of Current or Ex-Partner: No    Emotionally Abused: No    Physically Abused: No    Sexually Abused: No     Review of Systems: General: negative for chills, fever, night sweats or weight changes.  Cardiovascular: negative for chest pain, dyspnea on exertion, edema, orthopnea, palpitations, paroxysmal nocturnal dyspnea or shortness of breath Dermatological: negative for rash Respiratory: negative for cough or  wheezing Urologic: negative for hematuria Abdominal: negative for nausea, vomiting, diarrhea, bright red blood per rectum, melena, or hematemesis Neurologic: negative for visual changes, syncope, or dizziness All other systems reviewed and are otherwise negative except as noted above.    Blood pressure (!) 146/72, pulse 83, height 5' 7.5" (1.715 m), weight 182 lb 9.6 oz (82.8 kg), SpO2 99%.  General appearance: alert and no distress Neck: no adenopathy, no JVD, supple, symmetrical, trachea midline, thyroid  not enlarged, symmetric, no tenderness/mass/nodules, and bilateral carotid bruits versus transmitted murmur Lungs: clear to auscultation bilaterally Heart: 2/6 outflow tract murmur consistent with aortic stenosis Extremities: extremities normal, atraumatic, no cyanosis or edema Pulses: Diminished pedal pulses Skin:  Skin color, texture, turgor normal. No rashes or lesions Neurologic: Grossly normal  EKG not performed today      ASSESSMENT AND PLAN:   Hyperlipidemia History of hyperlipidemia on statin therapy with lipid profile performed 01/23/2023 revealing a total cholesterol of 293, LDL of 6 213 and HDL 61.  I am going to increase her rosuvastatin  from 10 to 40 mg a day.  Essential hypertension History of essential hypertension with blood pressure measured today at 146/72.  She is not on antihypertensive medications.  Unstable angina [facility]) Cardiac cath performed Dr. Arlester Ladd 03/29/2022 revealed mild nonobstructive CAD.  Peripheral arterial disease [facility]) Ms. Annamaria Barrette was referred to me by Amber Stone for PAD.  She had bilateral hip pain right much worse than left.  She did have Doppler studies that suggested a right ABI of 0.54 and a left was 0.88.  She did have a high-frequency signal in her proximal right common iliac artery with monophasic waveforms and less severe disease by frequency with monophasic waveforms on the left.  She was begun on Pletal  100 mg p.o. twice daily which  markedly improved her symptoms to the point where it still is noticeable but certainly not as much.  She wishes to delay angiography until after the first the year when she goes to Medicare.     Avanell Leigh MD FACP,FACC,FAHA, [facility] 07/04/2023 9:42 AM

## 2023-07-04 NOTE — Assessment & Plan Note (Signed)
 History of hyperlipidemia on statin therapy with lipid profile performed 01/23/2023 revealing a total cholesterol of 293, LDL of 6 213 and HDL 61.  I am going to increase her rosuvastatin  from 10 to 40 mg a day.

## 2023-07-04 NOTE — Assessment & Plan Note (Signed)
 Cardiac cath performed Dr. Arlester Ladd 03/29/2022 revealed mild nonobstructive CAD.

## 2023-09-06 ENCOUNTER — Other Ambulatory Visit (HOSPITAL_BASED_OUTPATIENT_CLINIC_OR_DEPARTMENT_OTHER): Payer: Self-pay | Admitting: Family

## 2023-09-07 ENCOUNTER — Other Ambulatory Visit: Payer: Self-pay

## 2023-09-07 MED ORDER — CILOSTAZOL 100 MG PO TABS: 100.0000 mg | ORAL_TABLET | Freq: Two times a day (BID) | ORAL | 3 refills | Status: AC

## 2023-09-25 ENCOUNTER — Encounter (HOSPITAL_BASED_OUTPATIENT_CLINIC_OR_DEPARTMENT_OTHER): Payer: Self-pay | Admitting: Family Medicine

## 2023-10-02 ENCOUNTER — Ambulatory Visit (HOSPITAL_BASED_OUTPATIENT_CLINIC_OR_DEPARTMENT_OTHER): Payer: No Typology Code available for payment source | Admitting: Family Medicine

## 2023-10-03 ENCOUNTER — Encounter (HOSPITAL_BASED_OUTPATIENT_CLINIC_OR_DEPARTMENT_OTHER): Payer: Self-pay | Admitting: Family Medicine

## 2023-10-03 ENCOUNTER — Ambulatory Visit (INDEPENDENT_AMBULATORY_CARE_PROVIDER_SITE_OTHER): Admitting: Family Medicine

## 2023-10-03 VITALS — BP 138/86 | HR 81 | Ht 67.5 in | Wt 177.9 lb

## 2023-10-03 DIAGNOSIS — I10 Essential (primary) hypertension: Secondary | ICD-10-CM

## 2023-10-03 DIAGNOSIS — M542 Cervicalgia: Secondary | ICD-10-CM | POA: Diagnosis not present

## 2023-10-03 DIAGNOSIS — Z0000 Encounter for general adult medical examination without abnormal findings: Secondary | ICD-10-CM

## 2023-10-03 NOTE — Patient Instructions (Signed)
  Medication Instructions:  Your physician recommends that you continue on your current medications as directed. Please refer to the Current Medication list given to you today. --If you need a refill on any your medications before your next appointment, please call your pharmacy first. If no refills are authorized on file call the office.-- Lab Work: Your physician has recommended that you have lab work today: 1 week before next visit  If you have labs (blood work) drawn today and your tests are completely normal, you will receive your results via MyChart message OR a phone call from our staff.  Please ensure you check your voicemail in the event that you authorized detailed messages to be left on a delegated number. If you have any lab test that is abnormal or we need to change your treatment, we will call you to review the results.   Follow-Up: Your next appointment:   Your physician recommends that you schedule a follow-up appointment in: 6 months physical with Dr. de Peru  You will receive a text message or e-mail with a link to a survey about your care and experience with [place] today! We would greatly appreciate your feedback!   Thanks for letting us be apart of your health journey!!  Primary Care and Sports Medicine   Dr. Ceasar Mons Peru   We encourage you to activate your patient portal called "MyChart".  Sign up information is provided on this After Visit Summary.  MyChart is used to connect with patients for Virtual Visits (Telemedicine).  Patients are able to view lab/test results, encounter notes, upcoming appointments, etc.  Non-urgent messages can be sent to your provider as well. To learn more about what you can do with MyChart, please visit --  [email or web address].

## 2023-10-03 NOTE — Assessment & Plan Note (Signed)
 Currently no antihypertensive medications.  Blood pressure today is borderline controlled.  No current issues with chest pain or headaches. Recommend intermittent monitoring of blood pressure at home, DASH diet

## 2023-10-03 NOTE — Progress Notes (Signed)
    Procedures performed today:    None.  Independent interpretation of notes and tests performed by another provider:   None.  Brief History, Exam, Impression, and Recommendations:    BP 138/86 (BP Location: Right Arm, Patient Position: Sitting, Cuff Size: Normal)   Pulse 81   Ht 5' 7.5 (1.715 m)   Wt 177 lb 14.4 oz (80.7 kg)   SpO2 98%   BMI 27.45 kg/m   Neck pain Assessment & Plan: Patient reports that back pain has been doing better.  She reports that she has been working with massage therapist and has felt significant improvement with this.  She ultimately did not have evaluation with neurosurgeon.  Given improvement that she has seen thus far, she feels that she will hold off on evaluation with specialist for the time being.  May consider evaluation with them in the future should symptoms return and not respond with conservative measures.   Wellness examination  Essential hypertension Assessment & Plan: Currently no antihypertensive medications.  Blood pressure today is borderline controlled.  No current issues with chest pain or headaches. Recommend intermittent monitoring of blood pressure at home, DASH diet   Return in about 6 months (around 04/04/2024) for CPE with fasting labs 1 week prior.   ___________________________________________ Amber Study de [place], MD, ABFM, CAQSM Primary Care [facility] Sports [facility]

## 2023-10-03 NOTE — Assessment & Plan Note (Signed)
 Patient reports that back pain has been doing better.  She reports that she has been working with massage therapist and has felt significant improvement with this.  She ultimately did not have evaluation with neurosurgeon.  Given improvement that she has seen thus far, she feels that she will hold off on evaluation with specialist for the time being.  May consider evaluation with them in the future should symptoms return and not respond with conservative measures.

## 2023-11-01 NOTE — Progress Notes (Unsigned)
 Cardiology Office Note:  .   Date:  11/02/2023  ID:  Amber Stone, DOB 09/29/1960, MRN [id number] PCP: de [place], Amber J, MD  [facility] Providers Cardiologist:  Amber Lesches, MD Cardiology APP:  Amber Reche RAMAN, NP    History of Present Illness: .   Amber Stone is a 63 y.o. female with history of nonobstructive CAD, familial hyperlipidemia, asthma, presumed COPD, GERD, mild aortic stenosis.   Admitted 12/29-02/28/2023 after presenting with progressive malaise, chest pain, shortness of breath.SABRA LHC with mild nonobstructive CAD.  Recommended for medical management echo normal LVEF 60-65%, no RWMA, gr1dd, mild aortic stenosis, mild to moderate AI. Dyspnea improved with Ellipta, presumed underlying COPD related to secondhand smoke exposure and recommended to see pulmonary.  Bilateral paresthesias of the hand felt to be musculoskeletal and recommended follow-up with neurosurgery in the outpatient. Treated for RUE cellulitis. Discharged with [facility] PT. 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   At visit 03/13/2023 due to lower extremity heaviness with walking ABI ordered with right ABI 0.75, left ABI 0.99.  She was started on Pletal  and referred to Dr. Lesches.  Subsequent imaging with left lower extremity SFA stenosis as well as stenosis of the right ICA. Her claudication significantly improved with Pletal . At visit 07/04/23 Rosuvastatin  increased to 40mg  daily as she previously declined injectable therapy. Plan was to delay lower extremity angiography until she had Medicare in 2026.   Presents today for follow up. Her claudication is stable since last visit with intense pain controlled on Pletal . Still reports limitations to activity level. She is unable to walk far distances, stand for long periods of time, and climb several flights of stairs without leg pain/heaviness. She is able to walk her dog without significant symptoms which she enjoys. She utilizes heating pad  application, topical castor oil, and rare use of ibuprofen  for breakthrough claudication. She is considering PV angiogram with Dr. Lesches in early 2026. Expressed concerns about possible length of hospital stay, recovery time, caregiver stress. She does report support sytem in her church family who may can assist her around this time. She has goals of increased exercise tolerance and wants to remain physically active. She denies chest pain, shortness of breath, lightheadedness, dizziness, edema, weight gain. 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  Her BP is elevated in office today. Home BPs 130s/70s.   She is taking rosuvastatin  40mg  twice weekly. This is tolerated. She does express concern about daily dose of statin and potential adverse effects.  She would also like to move echo to January 2026 as she is changing to medicare.   ROS: Please see the history of present illness.    All other systems reviewed and are negative.   Studies Reviewed: .          Cardiac Studies & Procedures   ______________________________________________________________________________________________ CARDIAC CATHETERIZATION  CARDIAC CATHETERIZATION 02/27/2023  Conclusion 1.  Mild nonobstructive CAD with mild ostial LAD stenosis, mild proximal LAD stenosis, and patency of the left circumflex and RCA without significant stenoses 2.  Normal/low LVEDP  Recommendations: Medical therapy, aggressive lipid-lowering as tolerated.  Findings Coronary Findings Diagnostic  Dominance: Right  Left Anterior Descending The LAD has mild ostial stenosis of 30%.  The proximal vessel has 30 to 40% stenosis.  There is no obstructive disease throughout the LAD distribution.  The vessel reaches the LV apex and the diagonal  branches are patent with no stenoses. Ost LAD lesion is 30% stenosed. Mid LAD lesion is 40% stenosed.  Left  Circumflex There is mild diffuse disease throughout the vessel. The LAD and circumflex have separate ostia.  The circumflex has no stenosis.  Vessel is of large caliber with minor irregularities noted.  Right Coronary Artery The vessel exhibits minimal luminal irregularities. Dominant vessel with no obstructive disease  Intervention  No interventions have been documented.     ECHOCARDIOGRAM  ECHOCARDIOGRAM COMPLETE 02/26/2023  Narrative ECHOCARDIOGRAM REPORT    Patient Name:   Amber Stone Date of Exam: 02/26/2023 Medical Rec #:  [id number]           Height:       67.0 in Accession #:    [id number]          Weight:       166.9 lb Date of Birth:  1960-04-25            BSA:          1.873 m Patient Age:    62 years            BP:           162/68 mmHg Patient Gender: F                   HR:           65 bpm. Exam Location:  Inpatient  Procedure: 2D Echo, Cardiac Doppler and Color Doppler  Indications:    Chest Pain R07.9  History:        Patient has no prior history of Echocardiogram examinations. Signs/Symptoms:Chest Pain; Risk Factors:Hypertension, Sleep Apnea and Dyslipidemia.  Sonographer:    Thea Norlander RCS Referring Phys: [phone] Amber Stone  IMPRESSIONS   1. Left ventricular ejection fraction, by estimation, is 60 to 65%. The left ventricle has normal function. The left ventricle has no regional wall motion abnormalities. Left ventricular diastolic parameters are consistent with Grade I diastolic dysfunction (impaired relaxation). 2. Right ventricular systolic function is normal. The right ventricular size is normal. 3. Left atrial size was moderately dilated. 4. The mitral valve is normal in structure. Trivial mitral valve regurgitation. No evidence of mitral stenosis. 5. The aortic valve is tricuspid. Aortic valve regurgitation is mild to moderate. Mild aortic valve stenosis. Aortic regurgitation PHT measures 468 msec. Aortic valve area, by VTI measures  1.19 cm. Aortic valve mean gradient measures 13.5 mmHg. Aortic valve Vmax measures 2.55 m/s. 6. The inferior vena cava is normal in size with greater than 50% respiratory variability, suggesting right atrial pressure of 3 mmHg.  FINDINGS Left Ventricle: Left ventricular ejection fraction, by estimation, is 60 to 65%. The left ventricle has normal function. The left ventricle has no regional wall motion abnormalities. The left ventricular internal cavity size was normal in size. There is no left ventricular hypertrophy. Left ventricular diastolic parameters are consistent with Grade I diastolic dysfunction (impaired relaxation). Indeterminate filling pressures.  Right Ventricle: The right ventricular size is normal. No increase in right ventricular wall thickness. Right ventricular systolic function is normal.  Left Atrium: Left atrial size was moderately dilated.  Right Atrium: Right atrial size was normal in size.  Pericardium: There is no evidence of pericardial effusion.  Mitral Valve: The mitral valve is normal in structure. Trivial mitral valve regurgitation. No evidence of mitral valve stenosis.  Tricuspid Valve: The tricuspid valve is normal in structure. Tricuspid valve regurgitation is trivial. No evidence of  tricuspid stenosis.  Aortic Valve: The aortic valve is tricuspid. Aortic valve regurgitation is mild to moderate. Aortic regurgitation PHT measures 468 msec. Mild aortic stenosis is present. Aortic valve mean gradient measures 13.5 mmHg. Aortic valve peak gradient measures 26.0 mmHg. Aortic valve area, by VTI measures 1.19 cm.  Pulmonic Valve: The pulmonic valve was normal in structure. Pulmonic valve regurgitation is trivial. No evidence of pulmonic stenosis.  Aorta: The aortic root is normal in size and structure.  Venous: The inferior vena cava is normal in size with greater than 50% respiratory variability, suggesting right atrial pressure of 3 mmHg.  IAS/Shunts: No  atrial level shunt detected by color flow Doppler.   LEFT VENTRICLE PLAX 2D LVIDd:         4.10 cm   Diastology LVIDs:         2.80 cm   LV e' medial:    7.46 cm/s LV PW:         2.10 cm   LV E/e' medial:  13.0 LV IVS:        1.20 cm   LV e' lateral:   8.55 cm/s LVOT diam:     1.90 cm   LV E/e' lateral: 11.3 LV SV:         66 LV SV Index:   35 LVOT Area:     2.84 cm   RIGHT VENTRICLE             IVC RV S prime:     15.10 cm/s  IVC diam: 1.80 cm TAPSE (M-mode): 2.2 cm  LEFT ATRIUM             Index        RIGHT ATRIUM           Index LA diam:        3.50 cm 1.87 cm/m   RA Area:     14.20 cm LA Vol (A2C):   66.0 ml 35.25 ml/m  RA Volume:   31.60 ml  16.88 ml/m LA Vol (A4C):   61.4 ml 32.79 ml/m LA Biplane Vol: 65.0 ml 34.71 ml/m AORTIC VALVE AV Area (Vmax):    1.29 cm AV Area (Vmean):   1.25 cm AV Area (VTI):     1.19 cm AV Vmax:           254.75 cm/s AV Vmean:          169.000 cm/s AV VTI:            0.554 m AV Peak Grad:      26.0 mmHg AV Mean Grad:      13.5 mmHg LVOT Vmax:         116.00 cm/s LVOT Vmean:        74.300 cm/s LVOT VTI:          0.232 m LVOT/AV VTI ratio: 0.42 AI PHT:            468 msec  AORTA Ao Root diam: 3.30 cm Ao Asc diam:  3.30 cm  MITRAL VALVE                TRICUSPID VALVE MV Area (PHT): 3.72 cm     TR Peak grad:   21.0 mmHg MV Decel Time: 204 msec     TR Vmax:        229.00 cm/s MR Peak grad: 20.4 mmHg MR Vmax:      226.00 cm/s   SHUNTS MV E velocity: 96.80 cm/s  Systemic VTI:  0.23 m MV A velocity: 126.00 cm/s  Systemic Diam: 1.90 cm MV E/A ratio:  0.77  Annabella Scarce MD Electronically signed by Annabella Scarce MD Signature Date/Time: 02/26/2023/5:40:35 PM    Final          ______________________________________________________________________________________________      Risk Assessment/Calculations:     HYPERTENSION CONTROL Vitals:   11/02/23 0755 11/02/23 0833  BP: (!) 140/76 (!) 140/78    The  patient's blood pressure is elevated above target today.  In order to address the patient's elevated BP: Blood pressure will be monitored at home to determine if medication changes need to be made.          Physical Exam:   VS:  BP (!) 140/78   Pulse 73   Resp 19   Ht 5' 7 (1.702 m)   Wt 179 lb (81.2 kg)   SpO2 98%   BMI 28.04 kg/m    Wt Readings from Last 3 Encounters:  11/02/23 179 lb (81.2 kg)  10/03/23 177 lb 14.4 oz (80.7 kg)  07/04/23 182 lb 9.6 oz (82.8 kg)    GEN: Well nourished, overweight. well developed in no acute distress NECK: No JVD; No carotid bruits CARDIAC: RRR, 2/6 murmur RUSB; no rubs, gallops; radial pulses 2+, PT pulses 1+ RESPIRATORY:  Clear to auscultation in all lung fields without rales, wheezing or rhonchi  ABDOMEN: Soft, non-tender, non-distended EXTREMITIES:  No edema; No deformity   ASSESSMENT AND PLAN: .    CAD - stable with no symptoms of ischemia; EKG not performed today Continue ASA 81mg  daily, rosuvastatin  40mg  twice weekly Continue to optimize diet with limiting salt/sodium, processed foods, sugary/sweet foods, and high fat foods Exercise recommendations (as tolerated) The American Heart Association recommends 150 minutes of moderate intensity exercise weekly. Try 30 minutes of moderate intensity exercise 4-5 times per week. This could include walking, jogging, or swimming.  Claudication / PAD - stable on current therapy Continue Pletal  100mg  twice daily Continue home therapeutic measures for breakthrough symptoms such as heating pad Follow up as scheduled with Dr. Court to discuss PV angiogram procedure, anticipated 02/2024 as will have Medicare  HLD, LDL goal <70 - uncontrolled, not at goal with LDL 213 (12/2022) Continue rosuvastatin  40mg  twice weekly, hesitant regarding daily dosing Update fasting lipids and LFTs  Consider addition of zetia - literature provided Did not tolerate atorvastatin , pravastatin . Declines  injectables.  Mild bilateral carotid stenosis - by doppler 02/2023; asymptomatic with no amaurosis fugax, lightheadedness Continue ASA 81mg  and rosuvastatin  40mg  twice weekly  Mild AS - by echo 01/2023; asymptomatic  Echo due 02/2024  Continue to monitor for chest pain, shortness of breath, syncope.        Dispo:  As scheduled with Dr. Court December 2025  Follow up with Reche GORMAN Finder, NP  in 9 months   Signed, Reche GORMAN Finder, NP

## 2023-11-02 ENCOUNTER — Encounter (HOSPITAL_BASED_OUTPATIENT_CLINIC_OR_DEPARTMENT_OTHER): Payer: Self-pay | Admitting: Family

## 2023-11-02 ENCOUNTER — Ambulatory Visit (HOSPITAL_BASED_OUTPATIENT_CLINIC_OR_DEPARTMENT_OTHER): Admitting: Family

## 2023-11-02 VITALS — BP 140/78 | HR 73 | Resp 19 | Ht 67.0 in | Wt 179.0 lb

## 2023-11-02 DIAGNOSIS — Z79899 Other long term (current) drug therapy: Secondary | ICD-10-CM | POA: Diagnosis not present

## 2023-11-02 DIAGNOSIS — I739 Peripheral vascular disease, unspecified: Secondary | ICD-10-CM

## 2023-11-02 DIAGNOSIS — I25118 Atherosclerotic heart disease of native coronary artery with other forms of angina pectoris: Secondary | ICD-10-CM

## 2023-11-02 DIAGNOSIS — E7800 Pure hypercholesterolemia, unspecified: Secondary | ICD-10-CM

## 2023-11-02 DIAGNOSIS — I350 Nonrheumatic aortic (valve) stenosis: Secondary | ICD-10-CM

## 2023-11-02 DIAGNOSIS — I6523 Occlusion and stenosis of bilateral carotid arteries: Secondary | ICD-10-CM

## 2023-11-02 DIAGNOSIS — E785 Hyperlipidemia, unspecified: Secondary | ICD-10-CM

## 2023-11-02 NOTE — Patient Instructions (Addendum)
 Medication Instructions:   NO CHANGES  *If you need a refill on your cardiac medications before your next appointment, please call your pharmacy*  Lab Work:  Lipid Panel, Liver Function Test  Please come FASTING to the next appointment with your husband The lab is on the 3rd Floor at [facility]  If you have labs (blood work) drawn today and your tests are completely normal, you will receive your results only by: MyChart Message (if you have MyChart) OR A paper copy in the mail If you have any lab test that is abnormal or we need to change your treatment, we will call you to review the results.  Testing/Procedures:  Echocardiogram - please move to January 2026  Follow-Up: At [facility], you and your health needs are our priority.  As part of our continuing mission to provide you with exceptional heart care, our providers are all part of one team.  This team includes your primary Cardiologist (physician) and Advanced Practice Providers or APPs (Physician Assistants and Nurse Practitioners) who all work together to provide you with the care you need, when you need it.  Your next appointment:    AS SCHEDULED with Dr. Court   IN 9 MONTHS WITH CAITLIN WALKER, NP   Other Instructions We will message you in 2 weeks to follow up on your BP  Tips to Measure your Blood Pressure Correctly  To determine whether you have hypertension, a medical professional will take a blood pressure reading. How you prepare for the test, the position of your arm, and other factors can change a blood pressure reading by 10% or more. That could be enough to hide high blood pressure, start you on a drug you don't really need, or lead your doctor to incorrectly adjust your medications.  National and international guidelines offer specific instructions for measuring blood pressure. If a doctor, nurse, or medical assistant isn't doing it right, don't hesitate to ask him or her to get with the  guidelines.  Here's what you can do to ensure a correct reading:  Don't drink a caffeinated beverage or smoke during the 30 minutes before the test.  Sit quietly for five minutes before the test begins.  During the measurement, sit in a chair with your feet on the floor and your arm supported so your elbow is at about heart level.  The inflatable part of the cuff should completely cover at least 80% of your upper arm, and the cuff should be placed on bare skin, not over a shirt.  Don't talk during the measurement.  Have your blood pressure measured twice, with a brief break in between. If the readings are different by 5 points or more, have it done a third time.  In 2017, new guidelines from the American Heart Association, the [employer] of Cardiology, and nine other health organizations lowered the diagnosis of high blood pressure to 130/80 mm Hg or higher for all adults. The guidelines also redefined the various blood pressure categories to now include normal, elevated, Stage 1 hypertension, Stage 2 hypertension, and hypertensive crisis (see Blood pressure categories).  Blood pressure categories  Blood pressure category SYSTOLIC (upper number)  DIASTOLIC (lower number)  Normal Less than 120 mm Hg and Less than 80 mm Hg  Elevated 120-129 mm Hg and Less than 80 mm Hg  High blood pressure: Stage 1 hypertension 130-139 mm Hg or 80-89 mm Hg  High blood pressure: Stage 2 hypertension 140 mm Hg or higher or 90  mm Hg or higher  Hypertensive crisis (consult your doctor immediately) Higher than 180 mm Hg and/or Higher than 120 mm Hg  Source: American Heart Association and American Stroke Association. For more on getting your blood pressure under control, buy Controlling Your Blood Pressure, a Special Health Report from [facility].   Blood Pressure Log   Date   Time  Blood Pressure  Position  Example: Nov 1 9 AM 124/78 sitting                                                      Ezetimibe Tablets  What is this medication? EZETIMIBE (ez ET i mibe) treats high cholesterol. It works by reducing the amount of cholesterol absorbed from the food you eat. This decreases the amount of bad cholesterol (such as LDL) in your blood. Changes to diet and exercise are often combined with this medication. This medicine may be used for other purposes; ask your health care provider or pharmacist if you have questions. COMMON BRAND NAME(S): Zetia What should I tell my care team before I take this medication? They need to know if you have any of these conditions: Kidney disease Liver disease Muscle cramps, pain Muscle injury Thyroid  disease An unusual or allergic reaction to ezetimibe, other medications, foods, dyes, or preservatives Pregnant or trying to get pregnant Breast-feeding How should I use this medication? Take this medication by mouth. Take it as directed on the prescription label at the same time every day. You can take it with or without food. If it upsets your stomach, take it with food. Keep taking it unless your care team tells you to stop. Take bile acid sequestrants at a different time of day than this medication. Take this medication 2 hours BEFORE or 4 hours AFTER bile acid sequestrants. Talk to your care team about the use of this medication in children. While it may be prescribed for children as young as 10 for selected conditions, precautions do apply. Overdosage: If you think you have taken too much of this medicine contact a poison control center or emergency room at once. NOTE: This medicine is only for you. Do not share this medicine with others. What if I miss a dose? If you miss a dose, take it as soon as you can. If it is almost time for your next dose, take only that dose. Do not take double or extra doses. What may interact with this medication? Do not take this medication with any of the  following: Fenofibrate Gemfibrozil This medication may also interact with the following: Antacids Cyclosporine Herbal medications like red yeast rice Other medications to lower cholesterol or triglycerides This list may not describe all possible interactions. Give your health care provider a list of all the medicines, herbs, non-prescription drugs, or dietary supplements you use. Also tell them if you smoke, drink alcohol, or use illegal drugs. Some items may interact with your medicine. What should I watch for while using this medication? Visit your care team for regular checks on your progress. Tell your care team if your symptoms do not start to get better or if they get worse. Your care team may tell you to stop taking this medication if you develop muscle problems. If your muscle problems do not go away after stopping this medication, contact your care team.  Talk to your care team if you wish to become pregnant or think you might be pregnant. This medication can cause serious birth defects if taken during pregnancy. Talk to your care team before breastfeeding. Changes to your treatment plan may be needed. Taking this medication is only part of a total heart healthy program. Ask your care team if there are other changes you can make to improve your overall health. What side effects may I notice from receiving this medication? Side effects that you should report to your doctor or health care provider as soon as possible: Allergic reactions--skin rash, itching or hives, swelling of the face, lips, tongue, or throat Side effects that usually do not require medical attention (report to your doctor or health care provider if they continue or are bothersome): Diarrhea Joint pain This list may not describe all possible side effects. Call your doctor for medical advice about side effects. You may report side effects to FDA at 1-800-FDA-1088. Where should I keep my medication? Keep out of the reach  of children and pets. Store at room temperature between 15 and 30 degrees C (59 and 86 degrees F). Protect from moisture. Get rid of any unused medication after the expiration date. NOTE: This sheet is a summary. It may not cover all possible information. If you have questions about this medicine, talk to your doctor, pharmacist, or health care provider.  2024 Elsevier/Gold Standard (2021-11-22 00:00:00)

## 2023-11-16 ENCOUNTER — Ambulatory Visit: Payer: Self-pay | Admitting: Cardiovascular Disease

## 2023-11-16 DIAGNOSIS — E785 Hyperlipidemia, unspecified: Secondary | ICD-10-CM

## 2023-11-16 LAB — HEPATIC FUNCTION PANEL
ALT: 15 IU/L (ref 0–32)
AST: 16 IU/L (ref 0–40)
Albumin: 4.5 g/dL (ref 3.9–4.9)
Alkaline Phosphatase: 74 IU/L (ref 49–135)
Bilirubin Total: 0.2 mg/dL (ref 0.0–1.2)
Bilirubin, Direct: 0.08 mg/dL (ref 0.00–0.40)
Total Protein: 7.2 g/dL (ref 6.0–8.5)

## 2023-11-16 LAB — LIPID PANEL
Chol/HDL Ratio: 4.5 ratio — ABNORMAL HIGH (ref 0.0–4.4)
Cholesterol, Total: 322 mg/dL — ABNORMAL HIGH (ref 100–199)
HDL: 72 mg/dL (ref >39)
LDL Chol Calc (NIH): 238 mg/dL — ABNORMAL HIGH (ref 0–99)
Triglycerides: 81 mg/dL (ref 0–149)
VLDL Cholesterol Cal: 12 mg/dL (ref 5–40)

## 2023-11-29 ENCOUNTER — Other Ambulatory Visit (HOSPITAL_BASED_OUTPATIENT_CLINIC_OR_DEPARTMENT_OTHER): Payer: BC Managed Care – PPO

## 2024-01-21 ENCOUNTER — Ambulatory Visit: Attending: Cardiology | Admitting: Pharmacist Clinician (PhC)/ Clinical Pharmacy Specialist

## 2024-01-21 ENCOUNTER — Encounter: Payer: Self-pay | Admitting: Pharmacist Clinician (PhC)/ Clinical Pharmacy Specialist

## 2024-01-21 DIAGNOSIS — E78019 Familial hypercholesterolemia, unspecified: Secondary | ICD-10-CM | POA: Diagnosis not present

## 2024-01-21 NOTE — Progress Notes (Signed)
 Office Visit    Patient Name: Amber Stone Date of Encounter: 01/21/2024  Primary Care Provider:  de [place], Quintin PARAS, MD Primary Cardiologist:  Dorn Lesches, MD  Chief Complaint    Hyperlipidemia - familial  Significant Past Medical History   CAD Cath 12/24 - 30-40% stenosis in LAD  PAD R -ABI 0.75, L - ABI 0.99     Allergies  Allergen Reactions   Egg Protein-Containing Drug Products    Other     Clams, hazelnuts., Chemical sensitivities, many fruits, veggies, grass, trees    History of Present Illness    Amber Stone is a 63 y.o. female patient of Dr Lesches, in the office today to discuss options for cholesterol management.  Amber Stone is hesitant to start new medication, has had issues with black mold and subsequent allergic issues that are just now resolving.    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  Currently eating chicken, beef and beans for protein, cabbage, green beans and beets, lots of dairy.  Treat is Dove chocolate  Exercise: Some limitations with walking 2/2 claudication, will work with Dr. Lesches in 2026 for possible stenting, needs to wait for Medicare to start     Accessory Clinical Findings   Lab Results  Component Value Date   CHOL 322 (H) 11/15/2023   HDL 72 11/15/2023   LDLCALC 238 (H) 11/15/2023   TRIG 81 11/15/2023   CHOLHDL 4.5 (H) 11/15/2023     Lipoprotein (a)  Date/Time Value Ref Range Status  02/27/2023 12:44 PM 306.5 (H) <75.0 nmol/L Final    Comment:    (NOTE) **Results verified by repeat testing** Note:  Values greater than or equal to 75.0 nmol/L may       indicate an independent risk factor for CHD,       but must be evaluated with caution when applied       to non-Caucasian populations due to the       influence of genetic factors on Lp(a) across       ethnicities. Performed At: [facility] [address] [place], [place] [id number] Jennette Shorter MD Ey:[id number]     Lab Results  Component Value Date   ALT 15 11/15/2023   AST 16 11/15/2023   ALKPHOS 74 11/15/2023   BILITOT 0.2 11/15/2023   Lab Results  Component Value Date   CREATININE 0.67 02/27/2023   BUN 10 02/27/2023   NA 136 02/27/2023   K 3.8 02/27/2023   CL 106 02/27/2023   CO2 21 (L) 02/27/2023   Lab Results  Component Value Date   HGBA1C 5.5 01/23/2023    Home Medications    Current Outpatient Medications  Medication Sig Dispense Refill   albuterol  (VENTOLIN  HFA) 108 (90 Base) MCG/ACT inhaler Inhale 2 puffs into the  lungs every 4 (four) hours as needed for wheezing or shortness of breath.     aspirin  EC 81 MG tablet Take 1 tablet (81 mg total) by mouth daily. Swallow whole.     budesonide -formoterol  (SYMBICORT ) 160-4.5 MCG/ACT inhaler Inhale 2 puffs into the lungs 2 (two) times daily. (Patient not taking: Reported on 11/02/2023) 3 Inhaler 3   cetirizine (ZYRTEC) 10 MG tablet Take 10 mg by mouth daily.     Cholecalciferol (VITAMIN D-3) 5000 UNITS TABS Take 1 tablet by mouth daily.     cilostazol  (PLETAL ) 100 MG tablet Take 1 tablet (100 mg total) by mouth 2 (two) times daily. 180 tablet 3   clemastine (TAVIST) 2.68 MG TABS tablet Take 2.68 mg by mouth 2 (two) times daily. Take 2.68mg  (1 tablet) by mouth every morning and 6.68-5.36mg  (1-2 tablets) at night.     hydrOXYzine  (ATARAX /VISTARIL ) 25 MG tablet Take 25 mg by mouth at  bedtime.     Lifitegrast (XIIDRA OP) Place 1 drop into both eyes 2 (two) times daily.     meclizine  (ANTIVERT ) 25 MG tablet Take 1 tablet (25 mg total) by mouth 3 (three) times daily as needed for dizziness or nausea. 30 tablet 3   montelukast  (SINGULAIR ) 10 MG tablet Take 10 mg by mouth in the morning.     Multiple Vitamin (MULTIVITAMIN) capsule Take 1 capsule by mouth daily.     omeprazole  (PRILOSEC) 40 MG capsule TAKE ONE CAPSULE BY MOUTH DAILY (Patient not taking: Reported on 11/02/2023) 90 capsule 1   No current facility-administered medications for this visit.     Assessment & Plan    Hyperlipidemia Assessment: Patient with familial hyperlipidemia not at LDL goal of < 100 Most recent LDL 238 on 11/15/23  Not able to tolerate statins secondary to myalgias, diarrhea Reviewed options for lowering LDL cholesterol, including ezetimibe, PCSK-9 inhibitors, bempedoic acid and inclisiran.  Discussed mechanisms of action, dosing, side effects, potential decreases in LDL cholesterol and costs.  Also reviewed potential options for patient assistance.  Plan: Patient would like to think about her options.  Will be changing insurance on Jan 1 to Medicare and supplement.  She will reach out once decision made as to which option she would like - has f/u with Dr Court on Jan 7.      Knight Oelkers, PharmD CPP [facility] [address]   [place], [place] [address] [phone]  01/21/2024, 10:59 AM

## 2024-01-21 NOTE — Patient Instructions (Signed)
 Your Results:             Your most recent labs Goal  Total Cholesterol 322 < 200  Triglycerides 81 < 150  HDL (happy/good cholesterol) 72 > 40  LDL (lousy/bad cholesterol 238 < 100   Medication changes:  Consider using Nexlizet as an option to lower your LDL cholesterol.  Injectable options include Repatha and Leqvio (below)  Reach out to me when you make a decision about starting the medication.    Patient Assistance:    We will sign you up for a Healthwell Grant once your medication is approved by [employer].  I will call you with the ID number, then you will take this information to the pharmacy.  They will bill it after your insurance, bringing your copay to $0.  The grant will pay the first $2,500 in a one year period.    ID   BIN [id number]  PCN [id number]  GRP [id number]    Thank you for choosing [facility]   Allean Mink PharmD  [phone] or via MyChart   Repatha (evolocumab)  Leqvio (inclisrin)   How is it given? Single use injectable pen you would use at home  An injection done by a healthcare professional in-clinic  How often do you take it? Once every 2 weeks 1st and 2nd dose 3 months apart, then every 6 months   White kind of side effects may you experience? -Flu-like symptoms for 36-48 hours following a dose, typically only for the first few doses -Injection site reactions -Bronchitis-like symptoms for 36-48 hours following a dose, typically only for the first few doses -Injection site reactions  How much will this lower your LDL? Are there other benefits? LDL reduction of up to 50-70% Also, this medication has shown that it can help to lower plaque burden when taken with a statin LDL reduction of up to 45-60%   How much will this cost? Comparable, however, there are HealthWell grants and manufacture copay cards that can help reduce the price Comparable, however, there are HealthWell grants and manufacture copay cards that can help reduce the price   (free with Medicare and supplement)

## 2024-01-21 NOTE — Assessment & Plan Note (Signed)
 Assessment: Patient with familial hyperlipidemia not at LDL goal of < 100 Most recent LDL 238 on 11/15/23  Not able to tolerate statins secondary to myalgias, diarrhea Reviewed options for lowering LDL cholesterol, including ezetimibe, PCSK-9 inhibitors, bempedoic acid and inclisiran.  Discussed mechanisms of action, dosing, side effects, potential decreases in LDL cholesterol and costs.  Also reviewed potential options for patient assistance.  Plan: Patient would like to think about her options.  Will be changing insurance on Jan 1 to Medicare and supplement.  She will reach out once decision made as to which option she would like - has f/u with Dr Court on Jan 7.

## 2024-02-06 ENCOUNTER — Ambulatory Visit: Admitting: Cardiovascular Disease

## 2024-03-03 ENCOUNTER — Ambulatory Visit (INDEPENDENT_AMBULATORY_CARE_PROVIDER_SITE_OTHER): Payer: MEDICARE

## 2024-03-03 ENCOUNTER — Ambulatory Visit (HOSPITAL_BASED_OUTPATIENT_CLINIC_OR_DEPARTMENT_OTHER): Payer: Self-pay | Admitting: Family

## 2024-03-03 DIAGNOSIS — I350 Nonrheumatic aortic (valve) stenosis: Secondary | ICD-10-CM | POA: Diagnosis not present

## 2024-03-03 DIAGNOSIS — R0609 Other forms of dyspnea: Secondary | ICD-10-CM

## 2024-03-03 LAB — ECHOCARDIOGRAM COMPLETE
AR max vel: 1.07 cm2
AV Area VTI: 1.15 cm2
AV Area mean vel: 1 cm2
AV Mean grad: 15 mmHg
AV Peak grad: 26.6 mmHg
AV Vena cont: 0.39 cm
Ao pk vel: 2.58 m/s
Area-P 1/2: 3.91 cm2
P 1/2 time: 404 ms
S' Lateral: 2.74 cm

## 2024-03-05 ENCOUNTER — Encounter: Payer: Self-pay | Admitting: Cardiovascular Disease

## 2024-03-05 ENCOUNTER — Ambulatory Visit: Payer: MEDICARE | Attending: Cardiovascular Disease | Admitting: Cardiovascular Disease

## 2024-03-05 VITALS — BP 140/84 | HR 75 | Ht 67.0 in | Wt 168.4 lb

## 2024-03-05 DIAGNOSIS — I739 Peripheral vascular disease, unspecified: Secondary | ICD-10-CM | POA: Insufficient documentation

## 2024-03-05 DIAGNOSIS — E785 Hyperlipidemia, unspecified: Secondary | ICD-10-CM | POA: Insufficient documentation

## 2024-03-05 DIAGNOSIS — I10 Essential (primary) hypertension: Secondary | ICD-10-CM | POA: Insufficient documentation

## 2024-03-05 NOTE — Progress Notes (Signed)
 "     03/05/2024 Amber Stone   Jun 07, 1960  [id number]  Primary Physician de Cuba, Raymond J, MD Primary Cardiologist: Dorn JINNY Lesches MD [place]I [place], [place]  HPI:  Amber Stone is a 64 y.o.    thin-appearing married Caucasian female, mother of 1 foster daughter, who was referred by Reche Finder, NP for evaluation of claudication.  I last saw her in the office 07/04/2023.  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 She is plan on retiring in 10 months. Her risk factors include hyperlipidemia recently put on statin therapy and strong family history of heart disease with a father who died of a myocardial infarction at age 32 and a brother who had an MI in his 70s and bypass surgery. She is never had a heart attack or stroke. She denies chest pain but does get dyspneic on exertion. Her 2D echo is essentially normal other than mild aortic stenosis and grade 1 diastolic dysfunction. She has noticed right greater than left hip pain for the last 6 months with which is lifestyle limiting. Keeps her from doing her activities of daily living including cooking, chores and walking her dog. She had ice performed by Reche Finder 04/27/2023 revealed a right ABI of 0.54 with monophasic waveforms and a left of 0.88. She was begun on Pletal  100 mg p.o. twice daily yesterday. I suspect she has either occluded or high-grade right common iliac artery stenosis and will most likely fail Pletal  at which point we will proceed with outpatient peripheral angiography and potential endovascular therapy.   Her claudication was significantly improved on Pletal  to the point where it only minimally affects her daily quality of life and activities of daily living.  She does have mild to moderate aortic stenosis with a soft outflow tract murmur and noncritical CAD by cath performed by Dr. Wonda 02/27/2023.  Since I saw her 7 months ago she still complains of morning claudication left  greater than the right at this point.  She wishes to entertain endovascular therapy.  She does have significantly elevated lipids however not on therapy with a total cholesterol of 322 and LDL 238.  I suggested that we address this prior to considering endovascular therapy in order to ensure long-term patency.  She denies chest pain or shortness of breath.   Active Medications[1]   Allergies[2]  Social History   Socioeconomic History   Marital status: Married    Spouse name: Not on file   Number of children: 1   Years of education: Not on file   Highest education level: Not on file  Occupational History    Comment: [employer]   Tobacco Use   Smoking status: Never    Passive exposure: Never   Smokeless tobacco: Never  Vaping Use   Vaping status: Never Used  Substance and Sexual Activity   Alcohol use: No   Drug use: No   Sexual activity: Not Currently    Birth control/protection: Post-menopausal  Other Topics Concern   Not on file  Social History Narrative   Lives with husband Al 24 years together      Enjoys: crafts, read, bake      Diet: limited due to food allergies- see allergy list    Caffeine:  3-4 cups daily   Water : 4 cups daily working to increase       Wears seat belt    Does not use phone while driving  Smoke detectors    Scientist, Forensic    No weapons       1 daughter that is adopted   Social Drivers of Health   Tobacco Use: Low Risk (03/05/2024)   Patient History    Smoking Tobacco Use: Never    Smokeless Tobacco Use: Never    Passive Exposure: Never  Financial Resource Strain: Not on file  Food Insecurity: No Food Insecurity (02/26/2023)   Hunger Vital Sign    Worried About Running Out of Food in the Last Year: Never true    Ran Out of Food in the Last Year: Never true  Transportation Needs: No Transportation Needs (02/26/2023)   PRAPARE - Administrator, Civil Service (Medical): No    Lack of Transportation  (Non-Medical): No  Physical Activity: Not on file  Stress: Not on file  Social Connections: Moderately Integrated (02/26/2023)   Social Connection and Isolation Panel    Frequency of Communication with Friends and Family: More than three times a week    Frequency of Social Gatherings with Friends and Family: Once a week    Attends Religious Services: More than 4 times per year    Active Member of [employer] or Organizations: No    Attends Banker Meetings: Never    Marital Status: Married  Catering Manager Violence: Not At Risk (02/26/2023)   Humiliation, Afraid, Rape, and Kick questionnaire    Fear of Current or Ex-Partner: No    Emotionally Abused: No    Physically Abused: No    Sexually Abused: No  Depression (PHQ2-9): Low Risk (10/03/2023)   Depression (PHQ2-9)    PHQ-2 Score: 0  Alcohol Screen: Not on file  Housing: Low Risk (02/26/2023)   Housing Stability Vital Sign    Unable to Pay for Housing in the Last Year: No    Number of Times Moved in the Last Year: 0    Homeless in the Last Year: No  Utilities: Not At Risk (02/26/2023)   AHC Utilities    Threatened with loss of utilities: No  Health Literacy: Adequate Health Literacy (02/23/2023)   B1300 Health Literacy    Frequency of need for help with medical instructions: Never     Review of Systems: General: negative for chills, fever, night sweats or weight changes.  Cardiovascular: negative for chest pain, dyspnea on exertion, edema, orthopnea, palpitations, paroxysmal nocturnal dyspnea or shortness of breath Dermatological: negative for rash Respiratory: negative for cough or wheezing Urologic: negative for hematuria Abdominal: negative for nausea, vomiting, diarrhea, bright red blood per rectum, melena, or hematemesis Neurologic: negative for visual changes, syncope, or dizziness All other systems reviewed and are otherwise negative except as noted above.    Blood pressure (!) 140/84, pulse 75, height 5' 7  (1.702 m), weight 168 lb 6.4 oz (76.4 kg), SpO2 94%.  General appearance: alert and no distress Neck: no adenopathy, no JVD, supple, symmetrical, trachea midline, thyroid  not enlarged, symmetric, no tenderness/mass/nodules, and soft bilateral carotid bruits Lungs: clear to auscultation bilaterally Heart: Soft outflow tract murmur Extremities: extremities normal, atraumatic, no cyanosis or edema Pulses: Diminished pedal pulses Skin: Skin color, texture, turgor normal. No rashes or lesions Neurologic: Grossly normal  EKG EKG Interpretation Date/Time:  Wednesday March 05 2024 09:08:20 EST Ventricular Rate:  75 PR Interval:  158 QRS Duration:  92 QT Interval:  410 QTC Calculation: 457 R Axis:   69  Text Interpretation: Normal sinus rhythm Minimal voltage criteria for LVH, may be normal  variant ( Sokolow-Lyon ) When compared with ECG of 26-Feb-2023 11:39, No significant change was found Confirmed by Court Carrier [phone]) on 03/05/2024 9:14:15 AM    ASSESSMENT AND PLAN:   Peripheral arterial disease Ms. Claw returns today for follow-up of PAD.  She was referred by Reche Finder, PA-C.  I last saw her in the office 07/04/2023.  She does have lifestyle-limiting claudication left greater than right which began 6 to 12 months ago.  Dopplers performed 05/22/2023 revealed high-grade right common iliac artery stenosis with monophasic waveforms as well as left iliac monophasic waveforms.  I did begin her on Pletal  which reported her some clinical benefit although she is still significantly limited and wishes to proceed with endovascular therapy.  Given her severely elevated cholesterol I have suggested that she see our Pharm.D. to discuss lipid-lowering options prior to stent implantation to ensure long-term patency.  I am also going to update her aortoiliac Dopplers and obtain a abdominal pelvic CTA to better define her anatomy.     Carrier DOROTHA Court MD FACP,FACC,FAHA, FSCAI 03/05/2024 9:27  AM     [1]  Current Meds  Medication Sig   albuterol  (VENTOLIN  HFA) 108 (90 Base) MCG/ACT inhaler Inhale 2 puffs into the lungs every 4 (four) hours as needed for wheezing or shortness of breath.   cetirizine (ZYRTEC) 10 MG tablet Take 10 mg by mouth daily.   Cholecalciferol (VITAMIN D-3) 5000 UNITS TABS Take 1 tablet by mouth daily.   cilostazol  (PLETAL ) 100 MG tablet Take 1 tablet (100 mg total) by mouth 2 (two) times daily.   cycloSPORINE (RESTASIS) 0.05 % ophthalmic emulsion Place 1 drop into both eyes 2 (two) times daily.   meclizine  (ANTIVERT ) 25 MG tablet Take 1 tablet (25 mg total) by mouth 3 (three) times daily as needed for dizziness or nausea.   Multiple Vitamin (MULTIVITAMIN) capsule Take 1 capsule by mouth daily.  [2]  Allergies Allergen Reactions   Egg Protein-Containing Drug Products    Other     Clams, hazelnuts., Chemical sensitivities, many fruits, veggies, grass, trees   "

## 2024-03-05 NOTE — Patient Instructions (Addendum)
 Medication Instructions:  Dr. Court recommends Repatha (PCSK9). This is an injectable cholesterol medication self-administered once every 14 days. This medication will likely need prior approval with your insurance company, which we will work on. If the medication is not approved initially, we may need to do an appeal with your insurance. If approved, we will provide you with copay and cost information. We'll then send the prescription to your pharmacy. We would have you complete another set of fasting labs between 3-4 months to reassess cholesterol.   Repatha is self-injected once every 14 days in subcutaneous or fatty tissue - such as belly or side/outer/upper thigh. It is best stored in the refrigerator but is stable at room temp up to 28 days. Please take the pen-injector out of fridge about 30 minutes - 1 hour prior to injection, to allow it to warm closer to room temperature.  Repatha, a monoclonal antibody therapy, lowers LDL by an average of 55-63%. It can also lower LP(a). It has also been studied and demonstrates cardiovascular risk reduction in persons with and without a prior cardiac history (such as heart attack or stroke). Here is more info: mrfebruary.hu  PCSK9 (a protein) binds to LDL receptors in the liver which results in breakdown of the LDL receptor. This prevents the liver from clearing out LDL (bad cholesterol). Repatha is a PCSK9 inhibitor, meaning it blocks this pathway.   Most common side effects:  Injection site reaction (similar findings in study group with medication and placebo group without medication) Runny nose, sore throat or symptoms of common cold which are often self-limiting and improve after subsequent injections as your body gets accustomed to the medication.    Here is a demo video: [email or web address]    *If you need a refill on your cardiac medications before your next appointment, please call  your pharmacy*  Lab Work: Today- BMET  Your physician recommends that you return for lab work in: 3 months for FASTING lipid/liver panel   If you have labs (blood work) drawn today and your tests are completely normal, you will receive your results only by: MyChart Message (if you have MyChart) OR A paper copy in the mail If you have any lab test that is abnormal or we need to change your treatment, we will call you to review the results.  Testing/Procedures: Non-Cardiac CT Angiography (CTA) abdomen/pelvis, is a special type of CT scan that uses a computer to produce multi-dimensional views of major blood vessels throughout the body. In CT angiography, a contrast material is injected through an IV to help visualize the blood vessels   Your physician has requested that you have an Aorta/Iliac Duplex. This will take place at [address] [facility], 4th floor  No food after 11PM the night before.  Water  is OK. (Don't drink liquids if you have been instructed not to for ANOTHER test) Avoid foods that produce bowel gas, for 24 hours prior to exam (see below). No breakfast, no chewing gum, no smoking or carbonated beverages. Patient may take morning medications with water . Come in for test at least 15 minutes early to register.  Please note: We ask at that you not bring children with you during ultrasound (echo/ vascular) testing. Due to room size and safety concerns, children are not allowed in the ultrasound rooms during exams. Our front office staff cannot provide observation of children in our lobby area while testing is being conducted. An adult accompanying a patient to their appointment will only be allowed in the ultrasound  room at the discretion of the ultrasound technician under special circumstances. We apologize for any inconvenience.  Your physician has requested that you have a lower extremity arterial duplex. During this test, ultrasound is used to evaluate arterial blood flow in the  legs. Allow one hour for this exam. There are no restrictions or special instructions. This will take place at [address], 4th floor  Please note: We ask at that you not bring children with you during ultrasound (echo/ vascular) testing. Due to room size and safety concerns, children are not allowed in the ultrasound rooms during exams. Our front office staff cannot provide observation of children in our lobby area while testing is being conducted. An adult accompanying a patient to their appointment will only be allowed in the ultrasound room at the discretion of the ultrasound technician under special circumstances. We apologize for any inconvenience.  Your physician has requested that you have an ankle brachial index (ABI). During this test an ultrasound and blood pressure cuff are used to evaluate the arteries that supply the arms and legs with blood. Allow thirty minutes for this exam. There are no restrictions or special instructions. This will take place at [address] 4th floor   Please note: We ask at that you not bring children with you during ultrasound (echo/ vascular) testing. Due to room size and safety concerns, children are not allowed in the ultrasound rooms during exams. Our front office staff cannot provide observation of children in our lobby area while testing is being conducted. An adult accompanying a patient to their appointment will only be allowed in the ultrasound room at the discretion of the ultrasound technician under special circumstances. We apologize for any inconvenience.   Follow-Up: At [facility], you and your health needs are our priority.  As part of our continuing mission to provide you with exceptional heart care, our providers are all part of one team.  This team includes your primary Cardiologist (physician) and Advanced Practice Providers or APPs (Physician Assistants and Nurse Practitioners) who all work together to provide you with the  care you need, when you need it.  Your next appointment:   3 month(s)  Provider:   Dorn Lesches, MD    We recommend signing up for the patient portal called MyChart.  Sign up information is provided on this After Visit Summary.  MyChart is used to connect with patients for Virtual Visits (Telemedicine).  Patients are able to view lab/test results, encounter notes, upcoming appointments, etc.  Non-urgent messages can be sent to your provider as well.   To learn more about what you can do with MyChart, go to [email or web address].

## 2024-03-05 NOTE — Assessment & Plan Note (Signed)
 Ms. Rossano returns today for follow-up of PAD.  She was referred by Reche Finder, PA-C.  I last saw her in the office 07/04/2023.  She does have lifestyle-limiting claudication left greater than right which began 6 to 12 months ago.  Dopplers performed 05/22/2023 revealed high-grade right common iliac artery stenosis with monophasic waveforms as well as left iliac monophasic waveforms.  I did begin her on Pletal  which reported her some clinical benefit although she is still significantly limited and wishes to proceed with endovascular therapy.  Given her severely elevated cholesterol I have suggested that she see our Pharm.D. to discuss lipid-lowering options prior to stent implantation to ensure long-term patency.  I am also going to update her aortoiliac Dopplers and obtain a abdominal pelvic CTA to better define her anatomy.

## 2024-03-06 ENCOUNTER — Telehealth: Payer: Self-pay | Admitting: Pharmacist Clinician (PhC)/ Clinical Pharmacy Specialist

## 2024-03-06 NOTE — Telephone Encounter (Signed)
 Please do PA for Repatha, will need Healthwell grant when approved.

## 2024-03-07 ENCOUNTER — Telehealth: Payer: Self-pay | Admitting: Pharmacy Technician

## 2024-03-07 ENCOUNTER — Other Ambulatory Visit (HOSPITAL_COMMUNITY): Payer: Self-pay

## 2024-03-07 LAB — BASIC METABOLIC PANEL WITH GFR
BUN/Creatinine Ratio: 16 (ref 12–28)
BUN: 14 mg/dL (ref 8–27)
CO2: 19 mmol/L — ABNORMAL LOW (ref 20–29)
Calcium: 9.3 mg/dL (ref 8.7–10.3)
Chloride: 101 mmol/L (ref 96–106)
Creatinine, Ser: 0.89 mg/dL (ref 0.57–1.00)
Glucose: 94 mg/dL (ref 70–99)
Potassium: 4.8 mmol/L (ref 3.5–5.2)
Sodium: 137 mmol/L (ref 134–144)
eGFR: 72 mL/min/1.73

## 2024-03-07 MED ORDER — PRALUENT 150 MG/ML ~~LOC~~ SOAJ: 150.0000 mg | SUBCUTANEOUS | 12 refills | Status: AC

## 2024-03-07 NOTE — Telephone Encounter (Signed)
 Pharmacy Patient Advocate Encounter  Received notification from [facility] that Prior Authorization for PRALUENT  has been APPROVED from 03/07/24 to 02/26/98   PA #/Case ID/Reference #: [id number]

## 2024-03-07 NOTE — Telephone Encounter (Signed)
 Patient Advocate Encounter   The patient was approved for a [employer] grant that will help cover the cost of praluent  Total amount awarded, 2500.  Effective: 02/06/24 - 02/04/25   APW:[id number] ERW:[id number] Hmnle:[id number] PI:[id number] [employer] ID: [id number]   Pharmacy provided with approval and processing information. Patient informed via mychart

## 2024-03-07 NOTE — Telephone Encounter (Signed)
" ° °  Pharmacy Patient Advocate Encounter   Received notification from Pt Calls Messages that prior authorization for repatha is required/requested.   Insurance verification completed.   The patient is insured through [facility].   Per test claim: PA required; PA submitted to above mentioned insurance via Latent Key/confirmation #/EOC [id number] Status is pending    Changed repatha to praluent  pa  Pharmacy Patient Advocate Encounter   Received notification from Pt Calls Messages that prior authorization for PRALUENT  is required/requested.   Insurance verification completed.   The patient is insured through [facility].   Per test claim: PA required; PA submitted to above mentioned insurance via Latent Key/confirmation #/EOC [id number] Status is pending  "

## 2024-03-08 ENCOUNTER — Ambulatory Visit: Payer: Self-pay | Admitting: Cardiovascular Disease

## 2024-03-11 ENCOUNTER — Ambulatory Visit (HOSPITAL_BASED_OUTPATIENT_CLINIC_OR_DEPARTMENT_OTHER): Admitting: Family Medicine

## 2024-03-14 ENCOUNTER — Ambulatory Visit (HOSPITAL_BASED_OUTPATIENT_CLINIC_OR_DEPARTMENT_OTHER): Payer: MEDICARE

## 2024-03-19 ENCOUNTER — Telehealth: Payer: Self-pay | Admitting: Cardiovascular Disease

## 2024-03-19 NOTE — Telephone Encounter (Signed)
-----   Message from Nurse Federico MATSU, RN sent at 03/19/2024  4:27 PM EST ----- Regarding: RE: 3 month follow up per Court Richard afternoon! Happy Hump Day!  Are you able to schedule this appointment?  Thank you! Kryestyn ----- Message ----- From: Marvis Line Sent: 03/17/2024   1:56 PM EST To: Federico LITTIE Ferretti, RN Subject: FW: 3 month follow up per Court LIPS ----- Message ----- From: Kingston Ming, Merl I Sent: 03/05/2024  10:18 AM EST To: [facility] Scheduling Subject: 3 month follow up per Court Lah,  I have this pt needing to schedule a 3 month follow up per Court.    Thank you

## 2024-03-19 NOTE — Telephone Encounter (Signed)
 Spoke to patient and she does not want to make a f/u appt. She said she has read up on the medication Dr Court is wanting her to take and she does not want to take it. She said it was against her religious beliefs.

## 2024-03-27 ENCOUNTER — Ambulatory Visit (HOSPITAL_BASED_OUTPATIENT_CLINIC_OR_DEPARTMENT_OTHER): Payer: MEDICARE

## 2024-04-03 ENCOUNTER — Encounter (HOSPITAL_BASED_OUTPATIENT_CLINIC_OR_DEPARTMENT_OTHER): Payer: Self-pay | Admitting: Family Medicine

## 2024-04-03 DIAGNOSIS — Z0000 Encounter for general adult medical examination without abnormal findings: Secondary | ICD-10-CM

## 2024-04-03 NOTE — Telephone Encounter (Signed)
 Please see mychart message sent by pt about her and spouse.

## 2024-04-09 ENCOUNTER — Encounter (HOSPITAL_BASED_OUTPATIENT_CLINIC_OR_DEPARTMENT_OTHER): Payer: MEDICARE

## 2024-04-10 ENCOUNTER — Encounter (HOSPITAL_BASED_OUTPATIENT_CLINIC_OR_DEPARTMENT_OTHER): Admitting: Family Medicine
